# Patient Record
Sex: Female | Born: 1935 | Race: White | Hispanic: No | Marital: Married | Smoking: Never smoker
Health system: Southern US, Community
[De-identification: ages and names within clinical notes are randomized; demographics above are authoritative.]

## PROBLEM LIST (undated history)

## (undated) DIAGNOSIS — E079 Disorder of thyroid, unspecified: Secondary | ICD-10-CM

## (undated) DIAGNOSIS — M1990 Unspecified osteoarthritis, unspecified site: Secondary | ICD-10-CM

## (undated) DIAGNOSIS — Z87440 Personal history of urinary (tract) infections: Secondary | ICD-10-CM

## (undated) DIAGNOSIS — C50919 Malignant neoplasm of unspecified site of unspecified female breast: Secondary | ICD-10-CM

## (undated) DIAGNOSIS — R569 Unspecified convulsions: Secondary | ICD-10-CM

## (undated) DIAGNOSIS — Z87898 Personal history of other specified conditions: Secondary | ICD-10-CM

## (undated) DIAGNOSIS — C801 Malignant (primary) neoplasm, unspecified: Secondary | ICD-10-CM

## (undated) DIAGNOSIS — I10 Essential (primary) hypertension: Secondary | ICD-10-CM

## (undated) HISTORY — DX: Essential (primary) hypertension: I10

## (undated) HISTORY — DX: Disorder of thyroid, unspecified: E07.9

## (undated) HISTORY — DX: Unspecified osteoarthritis, unspecified site: M19.90

---
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
## 1998-09-05 HISTORY — PX: BLADDER REPAIR: SHX76

## 1999-09-06 HISTORY — PX: TOTAL ABDOMINAL HYSTERECTOMY: SHX209

## 2001-03-15 ENCOUNTER — Ambulatory Visit (HOSPITAL_COMMUNITY): Admission: RE | Admit: 2001-03-15 | Discharge: 2001-03-15 | Payer: Self-pay | Admitting: Internal Medicine

## 2001-05-18 ENCOUNTER — Encounter: Payer: Self-pay | Admitting: Internal Medicine

## 2001-05-18 ENCOUNTER — Encounter: Admission: RE | Admit: 2001-05-18 | Discharge: 2001-05-18 | Payer: Self-pay | Admitting: Internal Medicine

## 2001-05-28 ENCOUNTER — Encounter: Payer: Self-pay | Admitting: Internal Medicine

## 2001-05-28 ENCOUNTER — Encounter: Admission: RE | Admit: 2001-05-28 | Discharge: 2001-05-28 | Payer: Self-pay | Admitting: Internal Medicine

## 2003-06-12 ENCOUNTER — Encounter: Payer: Self-pay | Admitting: Internal Medicine

## 2003-06-12 ENCOUNTER — Encounter: Admission: RE | Admit: 2003-06-12 | Discharge: 2003-06-12 | Payer: Self-pay | Admitting: Internal Medicine

## 2004-06-01 ENCOUNTER — Ambulatory Visit (HOSPITAL_BASED_OUTPATIENT_CLINIC_OR_DEPARTMENT_OTHER): Admission: RE | Admit: 2004-06-01 | Discharge: 2004-06-01 | Payer: Self-pay | Admitting: Internal Medicine

## 2004-06-04 ENCOUNTER — Ambulatory Visit (HOSPITAL_COMMUNITY): Admission: RE | Admit: 2004-06-04 | Discharge: 2004-06-04 | Payer: Self-pay | Admitting: Internal Medicine

## 2004-06-16 ENCOUNTER — Ambulatory Visit (HOSPITAL_BASED_OUTPATIENT_CLINIC_OR_DEPARTMENT_OTHER): Admission: RE | Admit: 2004-06-16 | Discharge: 2004-06-16 | Payer: Self-pay | Admitting: Internal Medicine

## 2004-10-30 ENCOUNTER — Emergency Department (HOSPITAL_COMMUNITY): Admission: EM | Admit: 2004-10-30 | Discharge: 2004-10-30 | Payer: Self-pay | Admitting: Emergency Medicine

## 2005-09-20 ENCOUNTER — Encounter: Admission: RE | Admit: 2005-09-20 | Discharge: 2005-09-20 | Payer: Self-pay | Admitting: *Deleted

## 2006-09-05 HISTORY — PX: HIP ARTHROPLASTY: SHX981

## 2007-06-04 ENCOUNTER — Encounter: Admission: RE | Admit: 2007-06-04 | Discharge: 2007-06-04 | Payer: Self-pay | Admitting: Gastroenterology

## 2007-06-11 ENCOUNTER — Encounter: Admission: RE | Admit: 2007-06-11 | Discharge: 2007-06-11 | Payer: Self-pay | Admitting: Internal Medicine

## 2008-07-14 ENCOUNTER — Encounter: Admission: RE | Admit: 2008-07-14 | Discharge: 2008-07-14 | Payer: Self-pay | Admitting: Internal Medicine

## 2008-07-28 ENCOUNTER — Encounter: Admission: RE | Admit: 2008-07-28 | Discharge: 2008-07-28 | Payer: Self-pay | Admitting: Internal Medicine

## 2008-09-05 HISTORY — PX: OTHER SURGICAL HISTORY: SHX169

## 2010-04-30 ENCOUNTER — Encounter: Admission: RE | Admit: 2010-04-30 | Discharge: 2010-04-30 | Payer: Self-pay | Admitting: Internal Medicine

## 2010-09-26 ENCOUNTER — Encounter: Payer: Self-pay | Admitting: Internal Medicine
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
## 2011-01-21 NOTE — Procedures (Signed)
NAME:  Robin Yu, Robin Yu NO.:  [id number]   MEDICAL RECORD NO.:  [id number]          PATIENT TYPE:  OUT   LOCATION:  SLEEP CENTER                 FACILITY:  [facility]   PHYSICIAN:  Clinton D. Maple Hudson, M.D. DATE OF BIRTH:  10-15-1921   DATE OF STUDY:  06/16/2004                              NOCTURNAL POLYSOMNOGRAM   REFERRING PHYSICIAN:  Georgann Housekeeper, MD   INDICATIONS FOR STUDY:  Hypersomnia with sleep apnea. Diagnostic NPSG  June 01, 2004, recorded RDI of 20 per hour with desaturation to 70%.  CPAP titration is now scheduled. BMI 27.2, weight 160 pounds.   SLEEP ARCHITECTURE:  Short total sleep time 282 minutes with sleep  efficiency of 79%. Stage I was 8%, stage II 64%, stages III and IV were 10%.  REM was 18% of total sleep time.  Sleep latency 1 minute. REM latency 132  minutes. Awake after sleep onset 73 minutes. Arousal index 27.   RESPIRATORY DATA:  CPAP titration study.  CPAP was titrated to 10 CWP,  RDI  0 per hour using a petite Respironics Comfort Gel mask with heated  humidifier. The technician added a deluxe chin strap to reduce leaks through  the mouth. The patient responded and tolerated CPAP well.   OXYGEN DATA:  No snoring on CPAP. Oxygen desaturation to a nadir of 89%  before CPAP control, but held at 95% to 96% on room air with CPAP.   CARDIAC DATA:  Normal sinus rhythm with occasional PAC.   MOVEMENT/PARASOMNIA:  194 limb jerks were recorded of which 38 were  associated with arousal or awakening for periodic limb movement with arousal  index of 8 per hour, which is increased. Bathroom times three.   IMPRESSION/RECOMMENDATIONS:  Successful CPAP titration to 10 CWP, RDI 0 per  hour using a petite Respironics Comfort Gel mask with heated humidifier.  Technician also added a deluxe chin strap which may or may not be necessary  at home.  Baseline diagnostic NPSG on June 01, 2004, had recorded an  RDI of 20 per hour with desaturation to 78%.   Additional diagnosis of  periodic limb movement with arousal, 8 per hour.  Note that patient  complained of right ankle pain for which she was taking an antibiotic.  Technician noted lower extremity edema bilaterally, greatest in the right  ankle. Also note potential poor sleep hygiene patient. The patient indicates  she has no set sleep/wake routine and some times goes to bed very late.                                                           Clinton D. Maple Hudson, M.D.  Diplomate, [employer] Board   CDY/MEDQ  D:  06/20/2004 12:38:12  T:  06/20/2004 21:24:51  Job:  [id number]

## 2011-08-01 ENCOUNTER — Other Ambulatory Visit: Payer: Self-pay | Admitting: Internal Medicine

## 2011-08-01 DIAGNOSIS — R52 Pain, unspecified: Secondary | ICD-10-CM

## 2011-08-01 DIAGNOSIS — R609 Edema, unspecified: Secondary | ICD-10-CM

## 2011-08-02 ENCOUNTER — Ambulatory Visit
Admission: RE | Admit: 2011-08-02 | Discharge: 2011-08-02 | Disposition: A | Discharge status: Home / Self Care | Payer: Medicare Other | Source: Ambulatory Visit | Attending: Internal Medicine | Admitting: Internal Medicine

## 2011-08-02 DIAGNOSIS — R52 Pain, unspecified: Secondary | ICD-10-CM

## 2011-08-02 DIAGNOSIS — R609 Edema, unspecified: Secondary | ICD-10-CM

## 2013-01-31 ENCOUNTER — Other Ambulatory Visit: Payer: Self-pay | Admitting: Internal Medicine

## 2013-01-31 DIAGNOSIS — Z1231 Encounter for screening mammogram for malignant neoplasm of breast: Secondary | ICD-10-CM

## 2013-03-07 ENCOUNTER — Ambulatory Visit: Payer: Medicare Other

## 2013-05-02 ENCOUNTER — Other Ambulatory Visit: Payer: Self-pay

## 2013-05-02 ENCOUNTER — Other Ambulatory Visit: Payer: Self-pay | Admitting: Internal Medicine

## 2013-05-02 DIAGNOSIS — Z1231 Encounter for screening mammogram for malignant neoplasm of breast: Secondary | ICD-10-CM

## 2013-05-17 ENCOUNTER — Ambulatory Visit: Payer: Medicare Other

## 2014-02-05 ENCOUNTER — Other Ambulatory Visit: Payer: Self-pay | Admitting: Internal Medicine

## 2014-02-05 DIAGNOSIS — N6320 Unspecified lump in the left breast, unspecified quadrant: Principal | ICD-10-CM

## 2014-02-05 DIAGNOSIS — N6325 Unspecified lump in the left breast, overlapping quadrants: Secondary | ICD-10-CM

## 2014-02-10 ENCOUNTER — Ambulatory Visit
Admission: RE | Admit: 2014-02-10 | Discharge: 2014-02-10 | Disposition: A | Discharge status: Home / Self Care | Payer: Commercial Managed Care - HMO | Source: Ambulatory Visit | Attending: Internal Medicine | Admitting: Internal Medicine

## 2014-02-10 ENCOUNTER — Other Ambulatory Visit: Payer: Self-pay | Admitting: Internal Medicine

## 2014-02-10 DIAGNOSIS — N6325 Unspecified lump in the left breast, overlapping quadrants: Secondary | ICD-10-CM

## 2014-02-10 DIAGNOSIS — N6320 Unspecified lump in the left breast, unspecified quadrant: Principal | ICD-10-CM

## 2014-02-13 ENCOUNTER — Ambulatory Visit: Payer: Medicare Other | Admitting: Physical Therapy

## 2014-02-19 ENCOUNTER — Ambulatory Visit
Admission: RE | Admit: 2014-02-19 | Discharge: 2014-02-19 | Disposition: A | Discharge status: Home / Self Care | Payer: Commercial Managed Care - HMO | Source: Ambulatory Visit | Attending: Internal Medicine | Admitting: Internal Medicine

## 2014-02-19 ENCOUNTER — Other Ambulatory Visit: Payer: Self-pay | Admitting: Internal Medicine

## 2014-02-19 DIAGNOSIS — N6325 Unspecified lump in the left breast, overlapping quadrants: Secondary | ICD-10-CM

## 2014-02-19 DIAGNOSIS — N6320 Unspecified lump in the left breast, unspecified quadrant: Principal | ICD-10-CM

## 2014-02-25 ENCOUNTER — Ambulatory Visit (INDEPENDENT_AMBULATORY_CARE_PROVIDER_SITE_OTHER): Payer: Commercial Managed Care - HMO | Admitting: General Surgery

## 2014-02-25 ENCOUNTER — Encounter (INDEPENDENT_AMBULATORY_CARE_PROVIDER_SITE_OTHER): Payer: Self-pay | Admitting: General Surgery

## 2014-02-25 ENCOUNTER — Ambulatory Visit: Payer: Commercial Managed Care - HMO | Attending: Internal Medicine | Admitting: Physical Therapy

## 2014-02-25 VITALS — BP 118/75 | HR 73 | Temp 98.4°F | Resp 14 | Ht 62.5 in | Wt 161.4 lb

## 2014-02-25 DIAGNOSIS — C50912 Malignant neoplasm of unspecified site of left female breast: Secondary | ICD-10-CM

## 2014-02-25 DIAGNOSIS — C50919 Malignant neoplasm of unspecified site of unspecified female breast: Secondary | ICD-10-CM

## 2014-02-25 NOTE — Progress Notes (Signed)
Chief Complaint: New diagnosis of breast cancer  History:    Robin Yu is a 80 y.o. postmenopausal female referred by Dr. Everlean Alstrom  for evaluation of recently diagnosed carcinoma of the left breast. She recently presented routine medical followup with Dr. Lorenda Hatchet and a left breast mass was noted..  Subsequent imaging included diagnostic mamogram showing architectural distortion behind the left nipple without apparent mass or suspicious calcifications. Some retraction of the left nipple was noted.  Ultrasound was performed showing a 2.8 x 1.5 x 2.5 cm irregular mass at the 12:00 position 1 cm from the nipple..   A ultrasound guided biopsy was performed on 02/19/2014 with pathology revealing invasive and in situ mammary carcinoma. Prognostic panel is currently pending. She is seen now in the office for initial treatment planning. The patient is in generally very good health for her age, living relatively independently without major medical problems. She has experienced no symptoms prior to this being discovered.  She does not have a personal history of any previous breast problems.  Findings at that time were the following:  Tumor size: 2.8 cm  Tumor grade: 2  Estrogen Receptor: pending Progesterone Receptor: pending  Her-2 neu: pending  Lymph node status: negative    Past Medical History  Diagnosis Date  . Arthritis   . Hypertension   . Thyroid disease     Past Surgical History  Procedure Laterality Date  . Hip arthroplasty  2008  . Total abdominal hysterectomy  2001  . Tonsillectomy  1940  . Bladder repair  2000  . Toe nail  2010    Current Outpatient Prescriptions  Medication Sig Dispense Refill  . amLODipine (NORVASC) 5 MG tablet Take 5 mg by mouth daily.      . calcium carbonate 200 MG capsule Take 250 mg by mouth 2 (two) times daily with a meal.      . Cranberry 125 MG TABS Take by mouth.      . divalproex (DEPAKOTE) 250 MG DR tablet Take 250 mg by mouth daily.       Marland Kitchen levothyroxine (SYNTHROID, LEVOTHROID) 50 MCG tablet Take 25 mcg by mouth daily before breakfast.       No current facility-administered medications for this visit.    Family History  Problem Relation Age of Onset  . Stroke Father     History   Social History  . Marital Status: Married    Spouse Name: N/A    Number of Children: N/A  . Years of Education: N/A   Social History Main Topics  . Smoking status: Never Smoker   . Smokeless tobacco: None  . Alcohol Use: No  . Drug Use: No  . Sexual Activity: None   Other Topics Concern  . None   Social History Narrative  . None     Review of Systems Constitutional: negative Ears, nose, mouth, throat, and face: positive for hearing loss Respiratory: negative Cardiovascular: negative Gastrointestinal: negative Genitourinary:positive for frequent urinary infections     Objective:  BP 118/75  Pulse 73  Temp(Src) 98.4 F (36.9 C)  Resp 14  Ht 5' 2.5" (1.588 m)  Wt 161 lb 6.4 oz (73.211 kg)  BMI 29.03 kg/m2  General: Alert, well-developed elderly female looking younger than her stated age, in no distress Skin: Warm and dry without rash or infection.  Multiple seborrheic keratoses on the trunk HEENT: No palpable masses or thyromegaly. Sclera nonicteric. Pupils equal round and reactive. Oropharynx clear. Breasts: in the left  breast just above and lateral to the nipple is an approximately 2-3 cm firm freely movable mass. Possible minimal nipple retraction. No skin changes. No other masses in either breast. Lymph nodes: No cervical, supraclavicular, or inguinal nodes palpable. Lungs: Breath sounds clear and equal without increased work of breathing Cardiovascular: Regular rate and rhythm without murmur. No JVD or edema. Peripheral pulses intact. Abdomen: Nondistended. Soft and nontender. No masses palpable. No organomegaly. No palpable hernias. Extremities: No edema or joint swelling or deformity. No chronic venous stasis  changes. Neurologic: Alert and fully oriented. Gait normal.   Laboratory data:  CBC:  No results found for this basename: WBC, RBC, HGB, HCT, PLT  ]  CM[facility] Labs:  No results found for this basename: GLUF, NA, K, CL, CO2, BUN, CREATININE, CALCIUM, PROT, ALB, BILITOT, BILIDIR, ALKPHOS, AST, ALT     Assessment  80 y.o. female with a new diagnosis of cancer of the the left breast upper outer quadrant.  Clinical IIA, prognostic panel pending. I discussed with the patient and daughter and close friend present today initial surgical treatment options. We discussed that at age 69 the office and tried to find an effective treatment that is not necessarily standard designed to minimize side effects. The patient is an overall very good health for age. I would favor initial surgical treatment. Her tumor is moderately large at 2.8 cm in directly behind the nipple. We could do a central lumpectomy but I believe would have to remove the nipple areolar complex and in that case I think a simple mastectomy might be the best treatment. She likely would not require any additional treatment. I discussed other potential options including lumpectomy alone, lumpectomy with hormone treatment or hormone treatment alone. I would not recommend lymph node evaluation formally. It is difficult to make a final decision today as we do not yet have the prognostic panel. We discussed all these treatments in detail. I would tend to lean toward simple mastectomy. The patient and her family would like to see medical oncology prior to making a final decision and we will make this referral for them. I will call them we'll have the results of the hormone receptors and see what they are thinking at that point.  Plan Await prognostic panel. Medical oncology evaluation. Possible simple mastectomy or lumpectomy.  Edward Jolly MD, FACS  02/25/2014, 3:55 PM

## 2014-02-26 ENCOUNTER — Other Ambulatory Visit (INDEPENDENT_AMBULATORY_CARE_PROVIDER_SITE_OTHER): Payer: Self-pay | Admitting: General Surgery

## 2014-02-27 ENCOUNTER — Telehealth (INDEPENDENT_AMBULATORY_CARE_PROVIDER_SITE_OTHER): Payer: Self-pay | Admitting: General Surgery

## 2014-02-27 NOTE — Telephone Encounter (Signed)
Spoke with daughter regarding treatment planning. She states the patient would like to have surgery and does not want hormone treatment. We discussed surgical treatment and we will plan to proceed with simple mastectomy as discussed.

## 2014-02-28 ENCOUNTER — Telehealth: Payer: Self-pay | Admitting: *Deleted

## 2014-02-28 NOTE — Telephone Encounter (Signed)
Received email back from [place] at [facility] stating that the pt has declined treatment.  I am cancelling the referral.

## 2014-02-28 NOTE — Telephone Encounter (Signed)
Received referral from [facility] and looked at the notes and it was questionable about after surgery treatment with the family members.  Emailed Selena Swaminathan at [employer] for clarification on Med Onc appt.   Awaiting response before I proceed.

## 2014-03-05 ENCOUNTER — Encounter (HOSPITAL_COMMUNITY): Payer: Self-pay | Admitting: Pharmacy Technician

## 2014-03-12 ENCOUNTER — Encounter (HOSPITAL_COMMUNITY)
Admission: RE | Admit: 2014-03-12 | Discharge: 2014-03-12 | Disposition: A | Discharge status: Home / Self Care | Payer: Medicare HMO | Source: Ambulatory Visit | Attending: General Surgery | Admitting: General Surgery

## 2014-03-12 ENCOUNTER — Encounter (HOSPITAL_COMMUNITY): Payer: Self-pay

## 2014-03-12 ENCOUNTER — Ambulatory Visit (HOSPITAL_COMMUNITY)
Admission: RE | Admit: 2014-03-12 | Discharge: 2014-03-12 | Disposition: A | Discharge status: Home / Self Care | Payer: Medicare HMO | Source: Ambulatory Visit | Attending: Anesthesiology | Admitting: Anesthesiology

## 2014-03-12 ENCOUNTER — Other Ambulatory Visit: Payer: Self-pay

## 2014-03-12 DIAGNOSIS — Z01812 Encounter for preprocedural laboratory examination: Secondary | ICD-10-CM | POA: Diagnosis not present

## 2014-03-12 DIAGNOSIS — Z01810 Encounter for preprocedural cardiovascular examination: Secondary | ICD-10-CM | POA: Insufficient documentation

## 2014-03-12 DIAGNOSIS — R918 Other nonspecific abnormal finding of lung field: Secondary | ICD-10-CM | POA: Diagnosis not present

## 2014-03-12 DIAGNOSIS — Z01818 Encounter for other preprocedural examination: Secondary | ICD-10-CM | POA: Insufficient documentation

## 2014-03-12 DIAGNOSIS — I10 Essential (primary) hypertension: Secondary | ICD-10-CM | POA: Diagnosis not present

## 2014-03-12 DIAGNOSIS — C50919 Malignant neoplasm of unspecified site of unspecified female breast: Secondary | ICD-10-CM | POA: Insufficient documentation

## 2014-03-12 DIAGNOSIS — M954 Acquired deformity of chest and rib: Secondary | ICD-10-CM | POA: Insufficient documentation

## 2014-03-12 HISTORY — DX: Malignant (primary) neoplasm, unspecified: C80.1

## 2014-03-12 HISTORY — DX: Personal history of urinary (tract) infections: Z87.440

## 2014-03-12 HISTORY — DX: Personal history of other specified conditions: Z87.898

## 2014-03-12 HISTORY — DX: Unspecified convulsions: R56.9

## 2014-03-12 HISTORY — DX: Malignant neoplasm of unspecified site of unspecified female breast: C50.919

## 2014-03-12 LAB — BASIC METABOLIC PANEL
Anion gap: 13 (ref 5–15)
BUN: 22 mg/dL (ref 6–23)
CO2: 27 meq/L (ref 19–32)
CREATININE: 0.66 mg/dL (ref 0.50–1.10)
Calcium: 10.2 mg/dL (ref 8.4–10.5)
Chloride: 97 mEq/L (ref 96–112)
GFR calc Af Amer: 87 mL/min — ABNORMAL LOW (ref >90)
GFR, EST NON AFRICAN AMERICAN: 75 mL/min — AB (ref >90)
GLUCOSE: 95 mg/dL (ref 70–99)
Potassium: 4.5 mEq/L (ref 3.7–5.3)
Sodium: 137 mEq/L (ref 137–147)

## 2014-03-12 LAB — CBC
HCT: 41.2 % (ref 36.0–46.0)
Hemoglobin: 13.6 g/dL (ref 12.0–15.0)
MCH: 29.2 pg (ref 26.0–34.0)
MCHC: 33 g/dL (ref 30.0–36.0)
MCV: 88.4 fL (ref 78.0–100.0)
Platelets: 254 10*3/uL (ref 150–400)
RBC: 4.66 MIL/uL (ref 3.87–5.11)
RDW: 13.7 % (ref 11.5–15.5)
WBC: 8.1 10*3/uL (ref 4.0–10.5)

## 2014-03-12 NOTE — Patient Instructions (Addendum)
Robin Yu  03/12/2014                           YOUR PROCEDURE IS SCHEDULED ON: 03/18/14 AT 7:30 AM               ENTER [place] ENTRANCE AND                            FOLLOW  SIGNS TO SHORT STAY CENTER                 ARRIVE AT SHORT STAY AT:  5:30 AM               CALL THIS NUMBER IF ANY PROBLEMS THE DAY OF SURGERY :               [phone]                                REMEMBER:   Do not eat food or drink liquids AFTER MIDNIGHT                  Take these medicines the morning of surgery with               A SIPS OF WATER :  AMLODIPINE / DEPAKOTE / LEVOTHYROXINE / METOPROLOL             DISCONTINUE ASPIRIN AND HERBAL MEDS 5 DAYS PREOP       Do not wear jewelry, make-up   Do not wear lotions, powders, or perfumes.   Do not shave legs or underarms 12 hrs. before surgery (men may shave face)  Do not bring valuables to the hospital.  Contacts, dentures or bridgework may not be worn into surgery.  Leave suitcase in the car. After surgery it may be brought to your room.  For patients admitted to the hospital more than one night, checkout time is            11:00 AM                                                       The day of discharge.   Patients discharged the day of surgery will not be allowed to drive home.            If going home same day of surgery, must have someone stay with you              FIRST 24 hrs at home and arrange for some one to drive you              home from hospital.   ________________________________________________________________________                [place]  Before surgery, you can play an important role.  Because skin is not sterile, your skin needs to be as free of germs as possible.  You can reduce the number of germs on your skin by washing with CHG (chlorahexidine gluconate) soap before surgery.  CHG is an antiseptic cleaner which kills germs and bonds with the skin to  continue killing germs  even after washing. Please DO NOT use if you have an allergy to CHG or antibacterial soaps.  If your skin becomes reddened/irritated stop using the CHG and inform your nurse when you arrive at Short Stay. Do not shave (including legs and underarms) for at least 48 hours prior to the first CHG shower.  You may shave your face. Please follow these instructions carefully:   1.  Shower with CHG Soap the night before surgery and the  morning of Surgery.   2.  If you choose to wash your hair, wash your hair first as usual with your  normal  Shampoo.   3.  After you shampoo, rinse your hair and body thoroughly to remove the  shampoo.                                         4.  Use CHG as you would any other liquid soap.  You can apply chg directly  to the skin and wash . Gently wash with scrungie or clean wascloth    5.  Apply the CHG Soap to your body ONLY FROM THE NECK DOWN.   Do not use on open                           Wound or open sores. Avoid contact with eyes, ears mouth and genitals (private parts).                        Genitals (private parts) with your normal soap.              6.  Wash thoroughly, paying special attention to the area where your surgery  will be performed.   7.  Thoroughly rinse your body with warm water from the neck down.   8.  DO NOT shower/wash with your normal soap after using and rinsing off  the CHG Soap .                9.  Pat yourself dry with a clean towel.             10.  Wear clean pajamas.             11.  Place clean sheets on your bed the night of your first shower and do not  sleep with pets.  Day of Surgery : Do not apply any lotions/deodorants the morning of surgery.  Please wear clean clothes to the hospital/surgery center.  FAILURE TO FOLLOW THESE INSTRUCTIONS MAY RESULT IN THE CANCELLATION OF YOUR SURGERY    PATIENT  SIGNATURE_________________________________  ______________________________________________________________________                                                                         Robin Yu  03/12/2014                          `

## 2014-03-14 ENCOUNTER — Other Ambulatory Visit (INDEPENDENT_AMBULATORY_CARE_PROVIDER_SITE_OTHER): Payer: Self-pay

## 2014-03-14 ENCOUNTER — Telehealth (INDEPENDENT_AMBULATORY_CARE_PROVIDER_SITE_OTHER): Payer: Self-pay

## 2014-03-14 DIAGNOSIS — C50919 Malignant neoplasm of unspecified site of unspecified female breast: Secondary | ICD-10-CM

## 2014-03-14 NOTE — Telephone Encounter (Signed)
Called to make family aware that patient will need a Chest CT Monday per Dr. Excell Seltzer.  Family aware that we will call Monday morning with time of arrival for test.  Family requested to have CT after 3pm if possible.  Family advised that Dr. Excell Seltzer will review results and discuss the morning of surgery (Tuesday)  Chest CT order placed in our referral coordinator tray to schedule.

## 2014-03-17 ENCOUNTER — Other Ambulatory Visit: Payer: Commercial Managed Care - HMO

## 2014-03-17 ENCOUNTER — Ambulatory Visit
Admission: RE | Admit: 2014-03-17 | Discharge: 2014-03-17 | Disposition: A | Discharge status: Home / Self Care | Payer: Commercial Managed Care - HMO | Source: Ambulatory Visit | Attending: General Surgery | Admitting: General Surgery

## 2014-03-17 DIAGNOSIS — C50919 Malignant neoplasm of unspecified site of unspecified female breast: Secondary | ICD-10-CM

## 2014-03-17 MED ORDER — IOHEXOL 300 MG/ML  SOLN
75.0000 mL | Freq: Once | INTRAMUSCULAR | Status: AC | PRN
  Administered 2014-03-17: 75 mL via INTRAVENOUS

## 2014-03-18 ENCOUNTER — Encounter (HOSPITAL_COMMUNITY): Payer: Medicare HMO | Admitting: Certified Registered Nurse Anesthetist

## 2014-03-18 ENCOUNTER — Ambulatory Visit (HOSPITAL_COMMUNITY): Payer: Medicare HMO | Admitting: Certified Registered Nurse Anesthetist

## 2014-03-18 ENCOUNTER — Encounter (HOSPITAL_COMMUNITY)
Admission: RE | Disposition: A | Discharge status: Home / Self Care | Payer: Self-pay | Source: Ambulatory Visit | Attending: General Surgery

## 2014-03-18 ENCOUNTER — Observation Stay (HOSPITAL_COMMUNITY)
Admission: RE | Admit: 2014-03-18 | Discharge: 2014-03-19 | Disposition: A | Discharge status: Home / Self Care | Payer: Medicare HMO | Source: Ambulatory Visit | Attending: General Surgery | Admitting: General Surgery

## 2014-03-18 ENCOUNTER — Encounter (HOSPITAL_COMMUNITY): Payer: Self-pay | Admitting: *Deleted

## 2014-03-18 DIAGNOSIS — I10 Essential (primary) hypertension: Secondary | ICD-10-CM | POA: Insufficient documentation

## 2014-03-18 DIAGNOSIS — L821 Other seborrheic keratosis: Secondary | ICD-10-CM | POA: Insufficient documentation

## 2014-03-18 DIAGNOSIS — C50919 Malignant neoplasm of unspecified site of unspecified female breast: Secondary | ICD-10-CM | POA: Diagnosis not present

## 2014-03-18 DIAGNOSIS — R569 Unspecified convulsions: Secondary | ICD-10-CM | POA: Diagnosis not present

## 2014-03-18 DIAGNOSIS — C50912 Malignant neoplasm of unspecified site of left female breast: Secondary | ICD-10-CM

## 2014-03-18 HISTORY — PX: TOTAL MASTECTOMY: SHX6129

## 2014-03-18 SURGERY — MASTECTOMY, SIMPLE
Anesthesia: General | Site: Breast | Laterality: Left

## 2014-03-18 MED ORDER — DIVALPROEX SODIUM 250 MG PO DR TAB: 250.0000 mg | DELAYED_RELEASE_TABLET | Freq: Two times a day (BID) | ORAL | Status: DC

## 2014-03-18 MED ORDER — AMLODIPINE BESYLATE 5 MG PO TABS
5.0000 mg | ORAL_TABLET | Freq: Every morning | ORAL | Status: DC
  Administered 2014-03-18: 5 mg via ORAL
  Filled 2014-03-18 (×2): qty 1

## 2014-03-18 MED ORDER — LEVOTHYROXINE SODIUM 25 MCG PO TABS
25.0000 ug | ORAL_TABLET | Freq: Every day | ORAL | Status: DC
  Administered 2014-03-19: 25 ug via ORAL
  Filled 2014-03-18 (×2): qty 1

## 2014-03-18 MED ORDER — LACTATED RINGERS IV SOLN
INTRAVENOUS | Status: DC | PRN
  Administered 2014-03-18 (×2): via INTRAVENOUS

## 2014-03-18 MED ORDER — HYDROMORPHONE HCL PF 1 MG/ML IJ SOLN: 0.2500 mg | INTRAMUSCULAR | Status: DC | PRN

## 2014-03-18 MED ORDER — SUCCINYLCHOLINE CHLORIDE 20 MG/ML IJ SOLN
INTRAMUSCULAR | Status: DC | PRN
  Administered 2014-03-18: 100 mg via INTRAVENOUS

## 2014-03-18 MED ORDER — MORPHINE SULFATE 10 MG/ML IJ SOLN: 1.0000 mg | INTRAMUSCULAR | Status: DC | PRN

## 2014-03-18 MED ORDER — FENTANYL CITRATE 0.05 MG/ML IJ SOLN
INTRAMUSCULAR | Status: AC
  Filled 2014-03-18: qty 5

## 2014-03-18 MED ORDER — ACETAMINOPHEN 10 MG/ML IV SOLN
1000.0000 mg | Freq: Once | INTRAVENOUS | Status: AC
  Administered 2014-03-18: 1000 mg via INTRAVENOUS
  Filled 2014-03-18: qty 100

## 2014-03-18 MED ORDER — ACETAMINOPHEN 500 MG PO TABS: 1000.0000 mg | ORAL_TABLET | Freq: Four times a day (QID) | ORAL | Status: DC | PRN

## 2014-03-18 MED ORDER — CIPROFLOXACIN IN D5W 400 MG/200ML IV SOLN
INTRAVENOUS | Status: AC
  Filled 2014-03-18: qty 200

## 2014-03-18 MED ORDER — DEXAMETHASONE SODIUM PHOSPHATE 10 MG/ML IJ SOLN
INTRAMUSCULAR | Status: AC
  Filled 2014-03-18: qty 1

## 2014-03-18 MED ORDER — ADULT MULTIVITAMIN W/MINERALS CH
1.0000 | ORAL_TABLET | Freq: Every day | ORAL | Status: DC
  Administered 2014-03-19: 1 via ORAL
  Filled 2014-03-18 (×2): qty 1

## 2014-03-18 MED ORDER — EPHEDRINE SULFATE 50 MG/ML IJ SOLN
INTRAMUSCULAR | Status: AC
  Filled 2014-03-18: qty 1

## 2014-03-18 MED ORDER — METOCLOPRAMIDE HCL 5 MG/ML IJ SOLN
INTRAMUSCULAR | Status: AC
  Filled 2014-03-18: qty 2

## 2014-03-18 MED ORDER — DIVALPROEX SODIUM 250 MG PO DR TAB
250.0000 mg | DELAYED_RELEASE_TABLET | Freq: Every day | ORAL | Status: DC
  Administered 2014-03-19: 250 mg via ORAL
  Filled 2014-03-18: qty 1

## 2014-03-18 MED ORDER — EPHEDRINE SULFATE 50 MG/ML IJ SOLN
INTRAMUSCULAR | Status: DC | PRN
  Administered 2014-03-18 (×5): 5 mg via INTRAVENOUS

## 2014-03-18 MED ORDER — SODIUM CHLORIDE 0.9 % IJ SOLN
INTRAMUSCULAR | Status: AC
  Filled 2014-03-18: qty 10

## 2014-03-18 MED ORDER — ATROPINE SULFATE 0.4 MG/ML IJ SOLN
INTRAMUSCULAR | Status: AC
  Filled 2014-03-18: qty 1

## 2014-03-18 MED ORDER — LIDOCAINE HCL (CARDIAC) 20 MG/ML IV SOLN
INTRAVENOUS | Status: AC
  Filled 2014-03-18: qty 5

## 2014-03-18 MED ORDER — CHLORHEXIDINE GLUCONATE 4 % EX LIQD: 1.0000 "application " | Freq: Once | CUTANEOUS | Status: DC

## 2014-03-18 MED ORDER — LIDOCAINE HCL (CARDIAC) 20 MG/ML IV SOLN
INTRAVENOUS | Status: DC | PRN
  Administered 2014-03-18: 50 mg via INTRAVENOUS

## 2014-03-18 MED ORDER — ONDANSETRON HCL 4 MG/2ML IJ SOLN
INTRAMUSCULAR | Status: DC | PRN
  Administered 2014-03-18 (×2): 2 mg via INTRAVENOUS

## 2014-03-18 MED ORDER — MORPHINE SULFATE 2 MG/ML IJ SOLN
1.0000 mg | INTRAMUSCULAR | Status: DC | PRN
  Administered 2014-03-18 (×2): 2 mg via INTRAVENOUS
  Filled 2014-03-18 (×2): qty 1

## 2014-03-18 MED ORDER — POTASSIUM CHLORIDE IN NACL 20-0.9 MEQ/L-% IV SOLN
INTRAVENOUS | Status: DC
  Administered 2014-03-18 – 2014-03-19 (×2): via INTRAVENOUS
  Filled 2014-03-18 (×2): qty 1000

## 2014-03-18 MED ORDER — LACTATED RINGERS IV SOLN: INTRAVENOUS | Status: DC

## 2014-03-18 MED ORDER — PROPOFOL 10 MG/ML IV BOLUS
INTRAVENOUS | Status: DC | PRN
  Administered 2014-03-18: 50 mg via INTRAVENOUS
  Administered 2014-03-18: 90 mg via INTRAVENOUS

## 2014-03-18 MED ORDER — PROPOFOL 10 MG/ML IV BOLUS
INTRAVENOUS | Status: AC
  Filled 2014-03-18: qty 20

## 2014-03-18 MED ORDER — 0.9 % SODIUM CHLORIDE (POUR BTL) OPTIME
TOPICAL | Status: DC | PRN
  Administered 2014-03-18: 1000 mL

## 2014-03-18 MED ORDER — ONDANSETRON HCL 4 MG/2ML IJ SOLN: 4.0000 mg | Freq: Four times a day (QID) | INTRAMUSCULAR | Status: DC | PRN

## 2014-03-18 MED ORDER — ONDANSETRON HCL 4 MG/2ML IJ SOLN
INTRAMUSCULAR | Status: AC
  Filled 2014-03-18: qty 2

## 2014-03-18 MED ORDER — METOPROLOL TARTRATE 25 MG PO TABS
25.0000 mg | ORAL_TABLET | Freq: Two times a day (BID) | ORAL | Status: DC
  Administered 2014-03-18 – 2014-03-19 (×2): 25 mg via ORAL
  Filled 2014-03-18 (×3): qty 1

## 2014-03-18 MED ORDER — ONDANSETRON HCL 4 MG PO TABS: 4.0000 mg | ORAL_TABLET | Freq: Four times a day (QID) | ORAL | Status: DC | PRN

## 2014-03-18 MED ORDER — DIVALPROEX SODIUM 500 MG PO DR TAB
500.0000 mg | DELAYED_RELEASE_TABLET | Freq: Every day | ORAL | Status: DC
  Administered 2014-03-18: 500 mg via ORAL
  Filled 2014-03-18 (×2): qty 1

## 2014-03-18 MED ORDER — FENTANYL CITRATE 0.05 MG/ML IJ SOLN
INTRAMUSCULAR | Status: DC | PRN
  Administered 2014-03-18: 100 ug via INTRAVENOUS
  Administered 2014-03-18 (×3): 50 ug via INTRAVENOUS

## 2014-03-18 MED ORDER — MIDAZOLAM HCL 2 MG/2ML IJ SOLN
INTRAMUSCULAR | Status: AC
  Filled 2014-03-18: qty 2

## 2014-03-18 MED ORDER — HEPARIN SODIUM (PORCINE) 5000 UNIT/ML IJ SOLN
5000.0000 [IU] | Freq: Three times a day (TID) | INTRAMUSCULAR | Status: DC
Start: 2014-03-18 — End: 2014-03-19
  Administered 2014-03-18 – 2014-03-19 (×2): 5000 [IU] via SUBCUTANEOUS
  Filled 2014-03-18 (×5): qty 1

## 2014-03-18 MED ORDER — HYDROCODONE-ACETAMINOPHEN 5-325 MG PO TABS
1.0000 | ORAL_TABLET | ORAL | Status: DC | PRN
  Administered 2014-03-18 – 2014-03-19 (×2): 1 via ORAL
  Filled 2014-03-18 (×2): qty 1

## 2014-03-18 MED ORDER — CIPROFLOXACIN IN D5W 400 MG/200ML IV SOLN
400.0000 mg | INTRAVENOUS | Status: AC
  Administered 2014-03-18: 400 mg via INTRAVENOUS

## 2014-03-18 MED ORDER — MORPHINE SULFATE 2 MG/ML IJ SOLN
INTRAMUSCULAR | Status: AC
  Administered 2014-03-18: 2 mg via INTRAVENOUS
  Filled 2014-03-18: qty 1

## 2014-03-18 MED ORDER — DEXAMETHASONE SODIUM PHOSPHATE 10 MG/ML IJ SOLN
INTRAMUSCULAR | Status: DC | PRN
  Administered 2014-03-18: 10 mg via INTRAVENOUS

## 2014-03-18 MED ORDER — MORPHINE SULFATE 2 MG/ML IJ SOLN
INTRAMUSCULAR | Status: AC
  Administered 2014-03-18: 1 mg via INTRAVASCULAR
  Filled 2014-03-18: qty 1

## 2014-03-18 SURGICAL SUPPLY — 48 items
APL SKNCLS STERI-STRIP NONHPOA (GAUZE/BANDAGES/DRESSINGS)
APPLIER CLIP 11 MED OPEN (CLIP)
APR CLP MED 11 20 MLT OPN (CLIP)
BENZOIN TINCTURE PRP APPL 2/3 (GAUZE/BANDAGES/DRESSINGS) ×2 IMPLANT
BLADE HEX COATED 2.75 (ELECTRODE) ×5 IMPLANT
BLADE SURG SZ10 CARB STEEL (BLADE) ×2 IMPLANT
CANISTER SUCTION 2500CC (MISCELLANEOUS) ×2 IMPLANT
CLIP APPLIE 11 MED OPEN (CLIP) IMPLANT
CLOSURE WOUND 1/2 X4 (GAUZE/BANDAGES/DRESSINGS)
COVER MAYO STAND STRL (DRAPES) IMPLANT
DRAIN CHANNEL 19F RND (DRAIN) ×2 IMPLANT
DRAIN CHANNEL RND F F (WOUND CARE) ×4 IMPLANT
DRAPE LAPAROSCOPIC ABDOMINAL (DRAPES) ×4 IMPLANT
DRAPE LG THREE QUARTER DISP (DRAPES) ×4 IMPLANT
DRSG PAD ABDOMINAL 8X10 ST (GAUZE/BANDAGES/DRESSINGS) ×2 IMPLANT
DRSG VASELINE 3X18 (GAUZE/BANDAGES/DRESSINGS) IMPLANT
ELECT REM PT RETURN 9FT ADLT (ELECTROSURGICAL) ×6
ELECTRODE REM PT RTRN 9FT ADLT (ELECTROSURGICAL) ×2 IMPLANT
EVACUATOR DRAINAGE 10X20 100CC (DRAIN) IMPLANT
EVACUATOR SILICONE 100CC (DRAIN) ×7 IMPLANT
GAUZE SPONGE 4X4 12PLY STRL (GAUZE/BANDAGES/DRESSINGS) ×4 IMPLANT
GLOVE BIOGEL PI IND STRL 7.0 (GLOVE) ×1 IMPLANT
GLOVE BIOGEL PI IND STRL 7.5 (GLOVE) IMPLANT
GLOVE BIOGEL PI INDICATOR 7.0 (GLOVE) ×6
GLOVE BIOGEL PI INDICATOR 7.5 (GLOVE) ×2
GLOVE SS BIOGEL STRL SZ 7.5 (GLOVE) IMPLANT
GLOVE SUPERSENSE BIOGEL SZ 7.5 (GLOVE) ×2
GOWN STRL REUS W/TWL LRG LVL3 (GOWN DISPOSABLE) ×3 IMPLANT
GOWN STRL REUS W/TWL XL LVL3 (GOWN DISPOSABLE) ×6 IMPLANT
KIT BASIN OR (CUSTOM PROCEDURE TRAY) ×4 IMPLANT
MARKER SKIN DUAL TIP RULER LAB (MISCELLANEOUS) ×2 IMPLANT
NS IRRIG 1000ML POUR BTL (IV SOLUTION) ×4 IMPLANT
PACK GENERAL/GYN (CUSTOM PROCEDURE TRAY) ×4 IMPLANT
SPONGE DRAIN TRACH 4X4 STRL 2S (GAUZE/BANDAGES/DRESSINGS) ×2 IMPLANT
SPONGE GAUZE 4X4 12PLY (GAUZE/BANDAGES/DRESSINGS) ×2 IMPLANT
SPONGE LAP 18X18 X RAY DECT (DISPOSABLE) ×8 IMPLANT
STAPLER VISISTAT 35W (STAPLE) ×4 IMPLANT
STRIP CLOSURE SKIN 1/2X4 (GAUZE/BANDAGES/DRESSINGS) ×2 IMPLANT
SUT ETHILON 3 0 PS 1 (SUTURE) ×2 IMPLANT
SUT ETHILON 4 0 PS 2 18 (SUTURE) IMPLANT
SUT VIC AB 3-0 54XBRD REEL (SUTURE) ×1 IMPLANT
SUT VIC AB 3-0 BRD 54 (SUTURE)
SUT VIC AB 3-0 SH 18 (SUTURE) ×4 IMPLANT
SUT VIC AB 3-0 SH 27 (SUTURE)
SUT VIC AB 3-0 SH 27XBRD (SUTURE) ×1 IMPLANT
TAPE CLOTH SURG 4X10 WHT LF (GAUZE/BANDAGES/DRESSINGS) ×2 IMPLANT
TOWEL OR 17X26 10 PK STRL BLUE (TOWEL DISPOSABLE) ×4 IMPLANT
TOWEL OR NON WOVEN STRL DISP B (DISPOSABLE) ×2 IMPLANT

## 2014-03-18 NOTE — Anesthesia Procedure Notes (Signed)
Procedure Name: Intubation Date/Time: 03/18/2014 7:41 AM Performed by: Ofilia Neas Pre-anesthesia Checklist: Patient identified, Timeout performed, Emergency Drugs available, Suction available and Patient being monitored Patient Re-evaluated:Patient Re-evaluated prior to inductionOxygen Delivery Method: Circle system utilized Preoxygenation: Pre-oxygenation with 100% oxygen Intubation Type: IV induction and Cricoid Pressure applied Ventilation: Mask ventilation without difficulty Laryngoscope Size: Mac and 4 Grade View: Grade III Tube type: Oral Tube size: 7.5 mm Number of attempts: 1 (anterior, rigid larynx. arytenoids vis with mac with cricoid/head lift) Airway Equipment and Method: Stylet Placement Confirmation: positive ETCO2 and breath sounds checked- equal and bilateral Secured at: 21 cm Tube secured with: Tape Dental Injury: Teeth and Oropharynx as per pre-operative assessment  Difficulty Due To: Difficulty was unanticipated, Difficult Airway- due to anterior larynx, Difficult Airway- due to immobile epiglottis, Difficult Airway- due to dentition and Difficult Airway- due to reduced neck mobility Future Recommendations: Recommend- induction with short-acting agent, and alternative techniques readily available

## 2014-03-18 NOTE — Transfer of Care (Signed)
Immediate Anesthesia Transfer of Care Note  Patient: Robin Yu  Procedure(s) Performed: Procedure(s): LEFT TOTAL MASTECTOMY (Left)  Patient Location: PACU  Anesthesia Type:General  Level of Consciousness: awake and patient cooperative  Airway & Oxygen Therapy: Patient Spontanous Breathing and Patient connected to face mask oxygen  Post-op Assessment: Report given to PACU RN, Post -op Vital signs reviewed and stable and Patient moving all extremities  Post vital signs: Reviewed and stable  Complications: No apparent anesthesia complications

## 2014-03-18 NOTE — Anesthesia Preprocedure Evaluation (Addendum)
Anesthesia Evaluation  Patient identified by MRN, date of birth, ID band Patient awake    Reviewed: Allergy & Precautions, H&P , NPO status , Patient's Chart, lab work & pertinent test results, reviewed documented beta blocker date and time   Airway Mallampati: II TM Distance: >3 FB Neck ROM: full    Dental no notable dental hx.    Pulmonary neg pulmonary ROS,  breath sounds clear to auscultation  Pulmonary exam normal       Cardiovascular Exercise Tolerance: Good hypertension, Pt. on medications and Pt. on home beta blockers Rhythm:regular Rate:Normal     Neuro/Psych Seizures -, Well Controlled,  Last seizure 2006 negative neurological ROS  negative psych ROS   GI/Hepatic negative GI ROS, Neg liver ROS,   Endo/Other  negative endocrine ROS  Renal/GU negative Renal ROS  negative genitourinary   Musculoskeletal   Abdominal   Peds  Hematology negative hematology ROS (+)   Anesthesia Other Findings   Reproductive/Obstetrics negative OB ROS                          Anesthesia Physical Anesthesia Plan  ASA: III  Anesthesia Plan: General   Post-op Pain Management:    Induction: Intravenous  Airway Management Planned: Oral ETT  Additional Equipment:   Intra-op Plan:   Post-operative Plan: Extubation in OR  Informed Consent: I have reviewed the patients History and Physical, chart, labs and discussed the procedure including the risks, benefits and alternatives for the proposed anesthesia with the patient or authorized representative who has indicated his/her understanding and acceptance.   Dental Advisory Given  Plan Discussed with: CRNA and Surgeon  Anesthesia Plan Comments:        Anesthesia Quick Evaluation

## 2014-03-18 NOTE — Op Note (Signed)
Preoperative Diagnosis: cancer left breast  Postoprative Diagnosis: cancer left breast  Procedure: Procedure(s): LEFT TOTAL MASTECTOMY   Surgeon: Excell Seltzer T   Assistants:  none  Anesthesia:  General endotracheal anesthesia  Indications:  The patient is a 80 year old female who presents with a proximally to a half centimeter invasive ductal carcinoma beneath the left nipple. After discussion with surgery medical oncology we have elected proceed with left simple mastectomy as initial treatment for her cancer    Procedure Detail:  Patient brought to the operating room, placed in supine position on the operating table, and general endotracheal anesthesia induced. She received preoperative IV antibiotics. PAS were in place. The left chest and breast were widely sterilely prepped and draped. The patient timeout was performed and correct procedure verified. An elliptical incision was used transversely encompassing the nipple areola complex. Skin and subcutaneous flaps were raised superiorly toward the clavicle, inferiorly toward the origin of the rectus, medially to the edge of the sternum and laterally out to the anterior border of latissimus dorsi. The breast was then reflected up off the pectoralis and serratus working medial to lateral. The specimen was dissected down to the axilla I came across the low axilla with Kelly clamps and the specimen was removed. This was tied with 3-0 Vicryl. The wound is irrigated and complete hemostasis obtained. The subcutaneous was closed with interrupted 3-0 Vicryl. Skin was closed with staples. A 19 Blake closed suction drain of the left beneath the flaps. Sponge needle and counts were correct. Dry sterile dressing was applied.   Estimated Blood Loss:  Minimal         Drains: 19 Blake  Blood Given: none          Specimens: left total mastectomy        Complications:  * No complications entered in OR log *         Disposition: PACU -  hemodynamically stable.         Condition: stable

## 2014-03-18 NOTE — Anesthesia Postprocedure Evaluation (Signed)
  Anesthesia Post-op Note  Patient: Robin Yu  Procedure(s) Performed: Procedure(s) (LRB): LEFT TOTAL MASTECTOMY (Left)  Patient Location: PACU  Anesthesia Type: General  Level of Consciousness: awake and alert   Airway and Oxygen Therapy: Patient Spontanous Breathing  Post-op Pain: mild  Post-op Assessment: Post-op Vital signs reviewed, Patient's Cardiovascular Status Stable, Respiratory Function Stable, Patent Airway and No signs of Nausea or vomiting  Last Vitals:  Filed Vitals:   03/18/14 1000  BP: 138/63  Pulse: 85  Temp: 36.5 C  Resp: 13    Post-op Vital Signs: stable   Complications: No apparent anesthesia complications

## 2014-03-19 LAB — CBC
HCT: 35.1 % — ABNORMAL LOW (ref 36.0–46.0)
Hemoglobin: 11.6 g/dL — ABNORMAL LOW (ref 12.0–15.0)
MCH: 29.1 pg (ref 26.0–34.0)
MCHC: 33 g/dL (ref 30.0–36.0)
MCV: 88.2 fL (ref 78.0–100.0)
PLATELETS: 229 10*3/uL (ref 150–400)
RBC: 3.98 MIL/uL (ref 3.87–5.11)
RDW: 13.8 % (ref 11.5–15.5)
WBC: 8.6 10*3/uL (ref 4.0–10.5)

## 2014-03-19 LAB — BASIC METABOLIC PANEL
ANION GAP: 11 (ref 5–15)
BUN: 14 mg/dL (ref 6–23)
CO2: 28 meq/L (ref 19–32)
CREATININE: 0.57 mg/dL (ref 0.50–1.10)
Calcium: 9.1 mg/dL (ref 8.4–10.5)
Chloride: 99 mEq/L (ref 96–112)
GFR calc non Af Amer: 79 mL/min — ABNORMAL LOW (ref >90)
Glucose, Bld: 104 mg/dL — ABNORMAL HIGH (ref 70–99)
Potassium: 4.5 mEq/L (ref 3.7–5.3)
SODIUM: 138 meq/L (ref 137–147)

## 2014-03-19 MED ORDER — HYDROCODONE-ACETAMINOPHEN 5-325 MG PO TABS: 1.0000 | ORAL_TABLET | ORAL | Status: DC | PRN

## 2014-03-19 NOTE — Discharge Instructions (Signed)
[facility] °[phone] ° °MASTECTOMY: POST OP INSTRUCTIONS ° °Always review your discharge instruction sheet given to you by the facility where your surgery was performed. °IF YOU HAVE DISABILITY OR FAMILY LEAVE FORMS, YOU MUST BRING THEM TO THE OFFICE FOR PROCESSING.   °DO NOT GIVE THEM TO YOUR DOCTOR. °A prescription for pain medication may be given to you upon discharge.  Take your pain medication as prescribed, if needed.  If narcotic pain medicine is not needed, then you may take acetaminophen (Tylenol) or ibuprofen (Advil) as needed. °1. Take your usually prescribed medications unless otherwise directed. °2. If you need a refill on your pain medication, please contact your pharmacy.  They will contact our office to request authorization.  Prescriptions will not be filled after 5pm or on week-ends. °3. You should follow a light diet the first few days after arrival home, such as soup and crackers, etc.  Resume your normal diet the day after surgery. °4. Most patients will experience some swelling and bruising on the chest and underarm.  Ice packs will help.  Swelling and bruising can take several days to resolve.  °5. It is common to experience some constipation if taking pain medication after surgery.  Increasing fluid intake and taking a stool softener (such as Colace) will usually help or prevent this problem from occurring.  A mild laxative (Milk of Magnesia or Miralax) should be taken according to package instructions if there are no bowel movements after 48 hours. °6. Unless discharge instructions indicate otherwise, leave your bandage dry and in place until your next appointment in 3-5 days.  You may take a limited sponge bath.  No tube baths or showers until the drains are removed.  You may have steri-strips (small skin tapes) in place directly over the incision.  These strips should be left on the skin for 7-10 days.  If your surgeon used skin glue on the incision, you may  shower in 24 hours.  The glue will flake off over the next 2-3 weeks.  Any sutures or staples will be removed at the office during your follow-up visit. °7. DRAINS:  If you have drains in place, it is important to keep a list of the amount of drainage produced each day in your drains.  Before leaving the hospital, you should be instructed on drain care.  Call our office if you have any questions about your drains. °8. ACTIVITIES:  You may resume regular (light) daily activities beginning the next day--such as daily self-care, walking, climbing stairs--gradually increasing activities as tolerated.  You may have sexual intercourse when it is comfortable.  Refrain from any heavy lifting or straining until approved by your doctor. °a. You may drive when you are no longer taking prescription pain medication, you can comfortably wear a seatbelt, and you can safely maneuver your car and apply brakes. °b. RETURN TO WORK:  __________________________________________________________ °9. You should see your doctor in the office for a follow-up appointment approximately 3-5 days after your surgery.  Your doctor’s nurse will typically make your follow-up appointment when she calls you with your pathology report.  Expect your pathology report 2-3 business days after your surgery.  You may call to check if you do not hear from us after three days.   °10. OTHER INSTRUCTIONS: ______________________________________________________________________________________________ ____________________________________________________________________________________________ °WHEN TO CALL YOUR DOCTOR: °1. Fever over 101.0 °2. Nausea and/or vomiting °3. Extreme swelling or bruising °4. Continued bleeding from incision. °5. Increased pain, redness, or drainage from the incision. °  The clinic staff is available to answer your questions during regular business hours.  Please don’t hesitate to call and ask to speak to one of the nurses for clinical  concerns.  If you have a medical emergency, go to the nearest emergency room or call 911.  A surgeon from [facility] is always on call at the hospital. °[address], [address], [place], [place]  [address] ? [address] [address], [place], [place]   [address] °[phone] ? [phone] ? FAX [phone] °Web site: [email or web address] °

## 2014-03-19 NOTE — Progress Notes (Signed)
Utilization review completed.  

## 2014-03-19 NOTE — Discharge Summary (Signed)
   Patient ID: [name] [id number] 80 y.o. 1922-07-24  03/18/2014  Discharge date and time: 03/19/2014   Admitting Physician: Excell Seltzer T  Discharge Physician: Excell Seltzer T  Admission Diagnoses: cancer left breast  Discharge Diagnoses: same  Operations: Procedure(s): LEFT TOTAL MASTECTOMY  Admission Condition: good  Discharged Condition: good  Indication for Admission: patient is a 80 year old female in very good health for her age she recently presented with a 2-1/2 cm invasive ductal carcinoma behind the left nipple. After workup and discussion of various treatment options we have elected to proceed with total mastectomy for surgical treatment of her breast cancer.  Hospital Course: patient was admitted on the morning of her procedure and underwent an uneventful left total mastectomy. She tolerated the procedure without incident. The following day she is alert and comfortable. The wound shows no swelling or evidence of bleeding. JP drainage is minimal. She is felt ready for discharge.   Disposition: Home  Patient Instructions:    Medication List         acetaminophen 500 MG tablet  Commonly known as:  TYLENOL  Take 1,000 mg by mouth every 6 (six) hours as needed for mild pain or moderate pain.     amLODipine 5 MG tablet  Commonly known as:  NORVASC  Take 5 mg by mouth every morning.     calcium carbonate 200 MG capsule  Take 200 mg by mouth 2 (two) times daily with a meal.     Cranberry 125 MG Tabs  Take 125 mg by mouth daily.     DEPAKOTE 250 MG DR tablet  Generic drug:  divalproex  Take 250-500 mg by mouth 2 (two) times daily. Takes 1 in the morning and 2 at night     HYDROcodone-acetaminophen 5-325 MG per tablet  Commonly known as:  NORCO/VICODIN  Take 1-2 tablets by mouth every 4 (four) hours as needed for moderate pain.     levothyroxine 50 MCG tablet  Commonly known as:  SYNTHROID, LEVOTHROID  Take 25 mcg by mouth daily before  breakfast.     metoprolol tartrate 25 MG tablet  Commonly known as:  LOPRESSOR  Take 25 mg by mouth 2 (two) times daily.     multivitamin with minerals Tabs tablet  Take 1 tablet by mouth daily.     POTASSIUM PO  Take 1 tablet by mouth daily.        Activity: activity as tolerated Diet: regular diet Wound Care: empty JP drain daily and record amount  Follow-up:  With Dr. Excell Seltzer in 1 week.  Signed: Edward Jolly MD, FACS  03/19/2014, 8:24 AM

## 2014-03-19 NOTE — Progress Notes (Signed)
Discharge instructions reviewed with patient and Dionicio Stall (POA/daughter) utilizing teach back method. Educated daughter on dressing changes and JP drain care and recording output daily.  Patient being discharged to home with daughter.

## 2014-03-20 ENCOUNTER — Telehealth (INDEPENDENT_AMBULATORY_CARE_PROVIDER_SITE_OTHER): Payer: Self-pay

## 2014-03-20 NOTE — Telephone Encounter (Signed)
Called pt to see how she was doing after sx. Pts daughter states that she is very well post op. Encouraged pt to give [place] a call if they need anything. Pt verbalized understanding.

## 2014-03-21 ENCOUNTER — Encounter (HOSPITAL_COMMUNITY): Payer: Self-pay | Admitting: General Surgery

## 2014-03-24 ENCOUNTER — Encounter (INDEPENDENT_AMBULATORY_CARE_PROVIDER_SITE_OTHER): Payer: Self-pay | Admitting: General Surgery

## 2014-03-24 ENCOUNTER — Ambulatory Visit (INDEPENDENT_AMBULATORY_CARE_PROVIDER_SITE_OTHER): Payer: Commercial Managed Care - HMO | Admitting: General Surgery

## 2014-03-24 VITALS — BP 140/80 | HR 72 | Temp 98.4°F | Resp 14 | Ht 67.0 in | Wt 160.2 lb

## 2014-03-24 DIAGNOSIS — Z09 Encounter for follow-up examination after completed treatment for conditions other than malignant neoplasm: Secondary | ICD-10-CM

## 2014-03-24 NOTE — Progress Notes (Signed)
Chief complaint: Followup mastectomy  History: Patient returns for followup one week following left total mastectomy. She has been somewhat tired and social work but no major problems.  Exam: BP 140/80  Pulse 72  Temp(Src) 98.4 F (36.9 C) (Oral)  Resp 14  Ht 5\' 7"  (1.702 m)  Wt 160 lb 3.2 oz (72.666 kg)  BMI 25.08 kg/m2 General: Appears well Incision: Left mastectomy wound healing nicely. JP drainage is serosanguineous. It was only 10 cc over the last 24 hours. Drain is removed.  Pathology Breast, simple mastectomy, left - TWO FOCI OF INVASIVE LOBULAR CARCINOMA, 5.8 CM AND 0.5 CM, NOTTINGHAM COMBINED HISTOLOGIC GRADE II. - SKIN: SEBORRHEIC KERATOSIS. - ONE OF THREE LYMPH NODES, POSITIVE FOR METASTATIC CARCINOMA (1/3). - RESECTION MARGINS, NEGATIVE FOR ATYPIA OR MALIGNANCY.  Assessment and plan: Doing well following mastectomy as above.  We discussed the pathology. Lymph nodes were obtained just incidentally total mastectomy. Our original plan with surgery alone with observation. I will discuss this in light of the pathology findings medical oncology to see if a referral would be in order although I think just clinical followup would be reasonable.  She will return in one week.

## 2014-03-26 ENCOUNTER — Telehealth (INDEPENDENT_AMBULATORY_CARE_PROVIDER_SITE_OTHER): Payer: Self-pay

## 2014-03-26 DIAGNOSIS — G8918 Other acute postprocedural pain: Secondary | ICD-10-CM

## 2014-03-26 MED ORDER — HYDROCODONE-ACETAMINOPHEN 5-325 MG PO TABS: 1.0000 | ORAL_TABLET | ORAL | Status: DC | PRN

## 2014-03-26 NOTE — Telephone Encounter (Signed)
Called patients daughter Silva Bandy) to make aware the refill request had been approved and will be at the front desk for pick up.  Kern Reap, JR will pick up medication for patient (ok per Silva Bandy, on [place])  Dr. Excell Seltzer aware that son-in-law will pick up rx for patient.

## 2014-03-26 NOTE — Addendum Note (Signed)
Addended by: Ivor Costa on: 03/26/2014 02:17 PM   Modules accepted: Orders

## 2014-03-26 NOTE — Telephone Encounter (Signed)
Pt s/p left mastectomy on 03/18/14 by Dr Excell Seltzer. Pts daughter is calling today to get a refill on pts Norco 5/325mg . Pt rates her pain a 8 at this time. Advised daughter that I would send a message to Dr Excell Seltzer for a refill and we would contact her as soon as we receive a message. Informed daughter if refilled they will need to come by the office to pick up Rx.  Pt verbalized understanding.

## 2014-04-02 ENCOUNTER — Encounter (INDEPENDENT_AMBULATORY_CARE_PROVIDER_SITE_OTHER): Payer: Commercial Managed Care - HMO | Admitting: General Surgery

## 2014-04-04 ENCOUNTER — Encounter (INDEPENDENT_AMBULATORY_CARE_PROVIDER_SITE_OTHER): Payer: Self-pay | Admitting: General Surgery

## 2014-04-04 ENCOUNTER — Ambulatory Visit (INDEPENDENT_AMBULATORY_CARE_PROVIDER_SITE_OTHER): Payer: Commercial Managed Care - HMO | Admitting: General Surgery

## 2014-04-04 VITALS — BP 122/80 | HR 73 | Temp 97.0°F | Ht 63.0 in | Wt 159.0 lb

## 2014-04-04 DIAGNOSIS — C50919 Malignant neoplasm of unspecified site of unspecified female breast: Secondary | ICD-10-CM

## 2014-04-04 DIAGNOSIS — G8918 Other acute postprocedural pain: Secondary | ICD-10-CM

## 2014-04-04 DIAGNOSIS — C50912 Malignant neoplasm of unspecified site of left female breast: Secondary | ICD-10-CM

## 2014-04-04 MED ORDER — HYDROCODONE-ACETAMINOPHEN 5-325 MG PO TABS: 1.0000 | ORAL_TABLET | ORAL | Status: DC | PRN

## 2014-04-04 NOTE — Progress Notes (Signed)
History: The patient returns for further followup after left total mastectomy for what proved to be pathologically T3 N1 invasive ductal carcinoma of the left breast with one of 3 nodes that came out with a simple mastectomy being positive. She is having some pain along the incision site.  Exam: BP 122/80  Pulse 73  Temp(Src) 97 F (36.1 C)  Ht 5\' 3"  (1.6 m)  Wt 159 lb (72.122 kg)  BMI 28.17 kg/m2 General: Appears well Chest wall incision is nicely healed without fluid or infection or other problems. Staples are removed. The  Assessment and plan: Recovering well from mastectomy as above. Having some discomfort but this should resolve with time. I refilled her pain medications. I discussed with the patient and her daughter the options for adjuvant hormonal therapy versus observation alone. We discussed that hormonal therapy would reduce her risk of recurrence. However she has been very reluctant to take medication that could have any side effect and at age 80 I do not think it would be unreasonable to opt for observation alone and start hormonal therapy if she were to have a recurrence. They understand these issues well and she does not want hormonal treatment. We will therefore plan observation alone which was our original plan was for simple mastectomy. She is given instructions for R. Range of motion exercises. Return in one month.

## 2014-04-10 ENCOUNTER — Telehealth (INDEPENDENT_AMBULATORY_CARE_PROVIDER_SITE_OTHER): Payer: Self-pay | Admitting: *Deleted

## 2014-04-10 NOTE — Telephone Encounter (Signed)
Mrs. Robin Yu called regarding pt.  She is s/p left masty 03-18-14.  She has already gotten a refill of her Hydrocodone 5-325 on 04-04-14.  She states pt is still in a lot of pain and is requesting another refill.  I advised [name] that I would have to get authorization from Dr. Excell Seltzer and me or his assistant would give her a call back and also I let her know that it could take up to 24 hours.  She verbalized understanding.  Please advise!  Anderson Malta

## 2014-04-11 ENCOUNTER — Other Ambulatory Visit (INDEPENDENT_AMBULATORY_CARE_PROVIDER_SITE_OTHER): Payer: Self-pay

## 2014-04-11 DIAGNOSIS — G8918 Other acute postprocedural pain: Secondary | ICD-10-CM

## 2014-04-11 MED ORDER — HYDROCODONE-ACETAMINOPHEN 5-325 MG PO TABS: 1.0000 | ORAL_TABLET | ORAL | Status: DC | PRN

## 2014-04-11 NOTE — Telephone Encounter (Signed)
Okay to refill hydrocodone

## 2014-04-11 NOTE — Telephone Encounter (Signed)
Informed pt that Norco Rx was written and will be ready to pick up at the front desk. Pt verbalized understanding

## 2014-04-22 ENCOUNTER — Telehealth (INDEPENDENT_AMBULATORY_CARE_PROVIDER_SITE_OTHER): Payer: Self-pay

## 2014-04-22 ENCOUNTER — Other Ambulatory Visit (INDEPENDENT_AMBULATORY_CARE_PROVIDER_SITE_OTHER): Payer: Self-pay

## 2014-04-22 MED ORDER — TRAMADOL HCL 50 MG PO TABS: 50.0000 mg | ORAL_TABLET | Freq: Four times a day (QID) | ORAL | Status: AC | PRN

## 2014-04-22 NOTE — Telephone Encounter (Signed)
How about Tramadol one q 6h prn # 30

## 2014-04-22 NOTE — Telephone Encounter (Signed)
Informed daughter a Tramadol Rx will be at the front desk ready for pick up

## 2014-04-22 NOTE — Telephone Encounter (Signed)
Pt s/p left mastectomy on 03/18/14 by Dr Excell Seltzer. Pts daughter is calling today to get a refill on pts pain medication. Pt has been getting Norco however she would like to try something not as strong this time.  Pt rates her pain a 5 at this time. Advised daughter that I would send a message to Dr Excell Seltzer for a refill and we would contact her as soon as we receive a message. Informed daughter if refilled they will need to come by the office to pick up Rx. Pt verbalized understanding.

## 2014-04-30 ENCOUNTER — Encounter (INDEPENDENT_AMBULATORY_CARE_PROVIDER_SITE_OTHER): Payer: Self-pay | Admitting: General Surgery

## 2014-04-30 ENCOUNTER — Ambulatory Visit (INDEPENDENT_AMBULATORY_CARE_PROVIDER_SITE_OTHER): Payer: Commercial Managed Care - HMO | Admitting: General Surgery

## 2014-04-30 ENCOUNTER — Other Ambulatory Visit (INDEPENDENT_AMBULATORY_CARE_PROVIDER_SITE_OTHER): Payer: Self-pay

## 2014-04-30 VITALS — BP 124/82 | HR 73 | Temp 97.6°F | Ht 63.0 in | Wt 154.0 lb

## 2014-04-30 DIAGNOSIS — R208 Other disturbances of skin sensation: Secondary | ICD-10-CM

## 2014-04-30 DIAGNOSIS — Z09 Encounter for follow-up examination after completed treatment for conditions other than malignant neoplasm: Secondary | ICD-10-CM

## 2014-04-30 NOTE — Patient Instructions (Signed)
Use Florastor or any probiotic.  I will call you regarding the urinalysis

## 2014-04-30 NOTE — Progress Notes (Signed)
History: Patient returns for more long-term followup after left total mastectomy for T3 N1 cancer of the left breast. As previously noted we have decided on observation only. She reports no problems related to her chest wall or arm. She is doing her exercises. Her daughter reports she has had couple of episodes of diarrhea and she was worried about C. Difficile that she has had previously. She has only had 2 episodes of diarrhea in the last month. Also she is having a little bit of lower abdominal discomfort and urinary burning the last day or 2.  Exam: BP 124/82  Pulse 73  Temp(Src) 97.6 F (36.4 C)  Ht 5\' 3"  (1.6 m)  Wt 154 lb (69.854 kg)  BMI 27.29 kg/m2 General: Appears well Chest wall: Incision well healed without complications. No arm edema Abdomen: Nontender  Assessment and plan: Doing well following mastectomy. Plan clinical follow up in 6 months. I will check a urinalysis and call with the results. If her symptoms continue she will need to follow up with primary care. I recommended a probiotic and observation for her to intermittent episodes of diarrhea and to call if this persists or gets any worse.

## 2014-05-02 ENCOUNTER — Telehealth (INDEPENDENT_AMBULATORY_CARE_PROVIDER_SITE_OTHER): Payer: Self-pay

## 2014-05-02 NOTE — Telephone Encounter (Signed)
Preliminary urine culture in epic faxed to Dr Deforest Hoyles per MD request.

## 2014-05-03 LAB — URINE CULTURE: Colony Count: 70000

## 2014-05-19 ENCOUNTER — Other Ambulatory Visit: Payer: Self-pay | Admitting: Internal Medicine

## 2014-05-19 DIAGNOSIS — R1084 Generalized abdominal pain: Secondary | ICD-10-CM

## 2014-05-23 ENCOUNTER — Ambulatory Visit
Admission: RE | Admit: 2014-05-23 | Discharge: 2014-05-23 | Disposition: A | Discharge status: Home / Self Care | Payer: Commercial Managed Care - HMO | Source: Ambulatory Visit | Attending: Internal Medicine | Admitting: Internal Medicine

## 2014-05-23 DIAGNOSIS — R1084 Generalized abdominal pain: Secondary | ICD-10-CM

## 2014-05-23 MED ORDER — IOHEXOL 300 MG/ML  SOLN
100.0000 mL | Freq: Once | INTRAMUSCULAR | Status: AC | PRN
  Administered 2014-05-23: 100 mL via INTRAVENOUS

## 2014-10-17 ENCOUNTER — Other Ambulatory Visit: Payer: Self-pay | Admitting: Physician Assistant

## 2015-01-11 ENCOUNTER — Emergency Department (HOSPITAL_COMMUNITY): Payer: Commercial Managed Care - HMO

## 2015-01-11 ENCOUNTER — Emergency Department (HOSPITAL_COMMUNITY)
Admission: EM | Admit: 2015-01-11 | Discharge: 2015-01-11 | Disposition: A | Discharge status: Home / Self Care | Payer: Commercial Managed Care - HMO | Attending: Emergency Medicine | Admitting: Emergency Medicine

## 2015-01-11 ENCOUNTER — Encounter (HOSPITAL_COMMUNITY): Payer: Self-pay

## 2015-01-11 DIAGNOSIS — E079 Disorder of thyroid, unspecified: Secondary | ICD-10-CM | POA: Diagnosis not present

## 2015-01-11 DIAGNOSIS — G40909 Epilepsy, unspecified, not intractable, without status epilepticus: Secondary | ICD-10-CM | POA: Insufficient documentation

## 2015-01-11 DIAGNOSIS — I10 Essential (primary) hypertension: Secondary | ICD-10-CM | POA: Diagnosis not present

## 2015-01-11 DIAGNOSIS — Y998 Other external cause status: Secondary | ICD-10-CM | POA: Diagnosis not present

## 2015-01-11 DIAGNOSIS — M1990 Unspecified osteoarthritis, unspecified site: Secondary | ICD-10-CM | POA: Diagnosis not present

## 2015-01-11 DIAGNOSIS — Y9289 Other specified places as the place of occurrence of the external cause: Secondary | ICD-10-CM | POA: Diagnosis not present

## 2015-01-11 DIAGNOSIS — S93402A Sprain of unspecified ligament of left ankle, initial encounter: Secondary | ICD-10-CM | POA: Diagnosis not present

## 2015-01-11 DIAGNOSIS — Z79899 Other long term (current) drug therapy: Secondary | ICD-10-CM | POA: Insufficient documentation

## 2015-01-11 DIAGNOSIS — Z87440 Personal history of urinary (tract) infections: Secondary | ICD-10-CM | POA: Insufficient documentation

## 2015-01-11 DIAGNOSIS — S99912A Unspecified injury of left ankle, initial encounter: Secondary | ICD-10-CM | POA: Diagnosis present

## 2015-01-11 DIAGNOSIS — Z853 Personal history of malignant neoplasm of breast: Secondary | ICD-10-CM | POA: Insufficient documentation

## 2015-01-11 DIAGNOSIS — W010XXA Fall on same level from slipping, tripping and stumbling without subsequent striking against object, initial encounter: Secondary | ICD-10-CM | POA: Diagnosis not present

## 2015-01-11 DIAGNOSIS — H9190 Unspecified hearing loss, unspecified ear: Secondary | ICD-10-CM | POA: Insufficient documentation

## 2015-01-11 DIAGNOSIS — Z880 Allergy status to penicillin: Secondary | ICD-10-CM | POA: Diagnosis not present

## 2015-01-11 DIAGNOSIS — Y9389 Activity, other specified: Secondary | ICD-10-CM | POA: Diagnosis not present

## 2015-01-11 NOTE — ED Provider Notes (Signed)
CSN: [id number]     Arrival date & time 01/11/15  1819 History   First MD Initiated Contact with Patient 01/11/15 1831     Chief Complaint  Patient presents with  . Fall  . Ankle Injury    left ankle     (Consider location/radiation/quality/duration/timing/severity/associated sxs/prior Treatment) HPI Patient tripped and fell on a step 1 PM today injuring her left ankle. She felt well prior to the event. She complains of left lateral ankle pain, which is nonradiating. Pain is worse with walking improved with remaining still. She has been ambulatory since the event. No other associated symptoms. No treatment prior to coming here. Past Medical History  Diagnosis Date  . Arthritis   . Hypertension   . Thyroid disease   . Seizures     LAST SEIZURE 2006  . Hx of jaundice     as teenager  . Hx: UTI (urinary tract infection)   . Cancer   . Breast cancer     left   Past Surgical History  Procedure Laterality Date  . Hip arthroplasty  2008    LEFT  . Total abdominal hysterectomy  2001  . Tonsillectomy  1940  . Bladder repair  2000  . Toe nail  2010  . Total mastectomy Left 03/18/2014    Procedure: LEFT TOTAL MASTECTOMY;  Surgeon: Edward Jolly, MD;  Location: [facility];  Service: General;  Laterality: Left;   Family History  Problem Relation Age of Onset  . Stroke Father    History  Substance Use Topics  . Smoking status: Never Smoker   . Smokeless tobacco: Not on file  . Alcohol Use: No   OB History    No data available     Review of Systems  Constitutional: Negative.   HENT: Positive for hearing loss.        Hard of hearing chronically  Musculoskeletal: Positive for arthralgias.       Left ankle pain      Allergies  Penicillins  Home Medications   Prior to Admission medications   Medication Sig Start Date End Date Taking? Authorizing Provider  acetaminophen (TYLENOL) 500 MG tablet Take 1,000 mg by mouth every 6 (six) hours as needed for mild pain or  moderate pain.    Historical Provider, MD  amLODipine (NORVASC) 5 MG tablet Take 5 mg by mouth every morning.     Historical Provider, MD  calcium carbonate 200 MG capsule Take 200 mg by mouth 2 (two) times daily with a meal.     Historical Provider, MD  Cranberry 125 MG TABS Take 125 mg by mouth daily.     Historical Provider, MD  divalproex (DEPAKOTE) 250 MG DR tablet Take 250-500 mg by mouth 2 (two) times daily. Takes 1 in the morning and 2 at night    Historical Provider, MD  levothyroxine (SYNTHROID, LEVOTHROID) 50 MCG tablet Take 25 mcg by mouth daily before breakfast.    Historical Provider, MD  metoprolol tartrate (LOPRESSOR) 25 MG tablet Take 25 mg by mouth 2 (two) times daily.    Historical Provider, MD  Multiple Vitamin (MULTIVITAMIN WITH MINERALS) TABS tablet Take 1 tablet by mouth daily.    Historical Provider, MD  polycarbophil (FIBERCON) 625 MG tablet Take 625 mg by mouth daily.    Historical Provider, MD  POTASSIUM PO Take 1 tablet by mouth daily.    Historical Provider, MD  traMADol (ULTRAM) 50 MG tablet Take 1 tablet (50 mg total) by mouth  every 6 (six) hours as needed. 04/22/14 04/22/15  Greer Pickerel, MD  vitamin B-12 (CYANOCOBALAMIN) 100 MCG tablet Take 100 mcg by mouth daily.    Historical Provider, MD   There were no vitals taken for this visit. Physical Exam  Constitutional: She is oriented to person, place, and time. She appears well-developed and well-nourished.  HENT:  Head: Normocephalic and atraumatic.  Eyes: Conjunctivae are normal. Pupils are equal, round, and reactive to light.  Neck: Neck supple. No tracheal deviation present. No thyromegaly present.  Cardiovascular: Normal rate and regular rhythm.   No murmur heard. Pulmonary/Chest: Effort normal and breath sounds normal.  Abdominal: Soft. Bowel sounds are normal. She exhibits no distension. There is no tenderness.  Musculoskeletal: Normal range of motion. She exhibits no edema or tenderness.  Left lower  extremity skin intact. Mildly swollen and tender over lateral malleolus. EP pulses 2+. Good capillary refill. Negative Thompson's test. Pelvis stable nontender.. No pain on internal or external rotation of either thigh. All other extremities without contusion abrasion or tenderness neurovascularly intact  Neurological: She is alert and oriented to person, place, and time. Coordination normal.  Skin: Skin is warm and dry. No rash noted.  Psychiatric: She has a normal mood and affect.  Nursing note and vitals reviewed.   ED Course  Procedures (including critical care time) Labs Review Labs Reviewed - No data to display  Imaging Review No results found.   EKG Interpretation None     Declines pain medicine  X-ray viewed by me. ASO ankle splint placed on patient, which patient finds comfortable. She walks with minimal limp favoring left leg after splint applied Results for orders placed or performed in visit on 04/30/14  Urine Culture  Result Value Ref Range   Culture ESCHERICHIA COLI    Colony Count 70,000 COLONIES/ML    Organism ID, Bacteria ESCHERICHIA COLI       Susceptibility   Escherichia coli -  (no method available)    AMPICILLIN >=32 Resistant     AMOX/CLAVULANIC 4 Sensitive     AMPICILLIN/SULBACTAM 16 Intermediate     PIP/TAZO <=4 Sensitive     IMIPENEM <=0.25 Sensitive     CEFAZOLIN <=4 Sensitive     CEFTRIAXONE <=1 Sensitive     CEFTAZIDIME <=1 Sensitive     CEFEPIME <=1 Sensitive     GENTAMICIN <=1 Sensitive     TOBRAMYCIN <=1 Sensitive     CIPROFLOXACIN <=0.25 Sensitive     LEVOFLOXACIN <=0.12 Sensitive     NITROFURANTOIN <=16 Sensitive     TRIMETH/SULFA <=20 Sensitive    Dg Ankle Complete Left  01/11/2015   CLINICAL DATA:  Twisting injury with lateral ankle pain  EXAM: LEFT ANKLE COMPLETE - 3+ VIEW  COMPARISON:  None.  FINDINGS: There is no evidence of fracture, dislocation, or joint effusion. There is no evidence of arthropathy or other focal bone  abnormality. Soft tissues are unremarkable.  IMPRESSION: No acute abnormality is noted.   Electronically Signed   By: Inez Catalina M.D.   On: 01/11/2015 19:06    MDM  Plan referral Dr.XU if needed one week. Tylenol for pain Diagnosis #1 fall  #2 left ankle sprain  Final diagnoses:  None        Orlie Dakin, MD 01/11/15 2116

## 2015-01-11 NOTE — ED Notes (Signed)
Pt in XR. 

## 2015-01-11 NOTE — ED Notes (Signed)
MD at bedside. 

## 2015-01-11 NOTE — ED Notes (Signed)
Pt tripped on step landing on left ankle at 1pm. Pt continues to c/o of left ankle pain.pt denies any other complaints. Slight swelling to left ankle. Increased pain with standing and walking

## 2015-01-11 NOTE — Discharge Instructions (Signed)
Wear the splint as needed for comfort. Take Tylenol as directed for pain. Call Dr.Xu if continuing to have significant pain or difficulty walking in one week Ankle Sprain An ankle sprain is an injury to the strong, fibrous tissues (ligaments) that hold your ankle bones together.  HOME CARE   Put ice on your ankle for 1-2 days or as told by your doctor.  Put ice in a plastic bag.  Place a towel between your skin and the bag.  Leave the ice on for 15-20 minutes at a time, every 2 hours while you are awake.  Only take medicine as told by your doctor.  Raise (elevate) your injured ankle above the level of your heart as much as possible for 2-3 days.  Use crutches if your doctor tells you to. Slowly put your own weight on the affected ankle. Use the crutches until you can walk without pain.  If you have a plaster splint:  Do not rest it on anything harder than a pillow for 24 hours.  Do not put weight on it.  Do not get it wet.  Take it off to shower or bathe.  If given, use an elastic wrap or support stocking for support. Take the wrap off if your toes lose feeling (numb), tingle, or turn cold or blue.  If you have an air splint:  Add or let out air to make it comfortable.  Take it off at night and to shower and bathe.  Wiggle your toes and move your ankle up and down often while you are wearing it. GET HELP IF:  You have rapidly increasing bruising or puffiness (swelling).  Your toes feel very cold.  You lose feeling in your foot.  Your medicine does not help your pain. GET HELP RIGHT AWAY IF:   Your toes lose feeling (numb) or turn blue.  You have severe pain that is increasing. MAKE SURE YOU:   Understand these instructions.  Will watch your condition.  Will get help right away if you are not doing well or get worse. Document Released: 02/08/2008 Document Revised: 01/06/2014 Document Reviewed: 03/05/2012 [facility] Patient Information 2015 [place], [place].  This information is not intended to replace advice given to you by your health care provider. Make sure you discuss any questions you have with your health care provider.

## 2015-03-04 ENCOUNTER — Other Ambulatory Visit: Payer: Self-pay | Admitting: Internal Medicine

## 2015-03-04 DIAGNOSIS — R109 Unspecified abdominal pain: Secondary | ICD-10-CM

## 2015-03-06 ENCOUNTER — Ambulatory Visit
Admission: RE | Admit: 2015-03-06 | Discharge: 2015-03-06 | Disposition: A | Discharge status: Home / Self Care | Payer: Commercial Managed Care - HMO | Source: Ambulatory Visit | Attending: Internal Medicine | Admitting: Internal Medicine

## 2015-03-06 DIAGNOSIS — R109 Unspecified abdominal pain: Secondary | ICD-10-CM

## 2015-04-08 ENCOUNTER — Other Ambulatory Visit: Payer: Self-pay | Admitting: Physician Assistant

## 2015-04-08 DIAGNOSIS — R1032 Left lower quadrant pain: Secondary | ICD-10-CM

## 2015-04-10 ENCOUNTER — Ambulatory Visit
Admission: RE | Admit: 2015-04-10 | Discharge: 2015-04-10 | Disposition: A | Discharge status: Home / Self Care | Payer: Commercial Managed Care - HMO | Source: Ambulatory Visit | Attending: Physician Assistant | Admitting: Physician Assistant

## 2015-04-10 DIAGNOSIS — R1032 Left lower quadrant pain: Secondary | ICD-10-CM

## 2015-04-10 MED ORDER — IOPAMIDOL (ISOVUE-300) INJECTION 61%
100.0000 mL | Freq: Once | INTRAVENOUS | Status: AC | PRN
  Administered 2015-04-10: 100 mL via INTRAVENOUS

## 2015-04-22 ENCOUNTER — Other Ambulatory Visit: Payer: Self-pay | Admitting: Physician Assistant

## 2015-04-22 DIAGNOSIS — K5780 Diverticulitis of intestine, part unspecified, with perforation and abscess without bleeding: Secondary | ICD-10-CM

## 2015-04-27 ENCOUNTER — Ambulatory Visit
Admission: RE | Admit: 2015-04-27 | Discharge: 2015-04-27 | Disposition: A | Discharge status: Home / Self Care | Payer: Commercial Managed Care - HMO | Source: Ambulatory Visit | Attending: Physician Assistant | Admitting: Physician Assistant

## 2015-04-27 DIAGNOSIS — K5780 Diverticulitis of intestine, part unspecified, with perforation and abscess without bleeding: Secondary | ICD-10-CM

## 2015-04-27 MED ORDER — IOPAMIDOL (ISOVUE-300) INJECTION 61%
100.0000 mL | Freq: Once | INTRAVENOUS | Status: AC | PRN
  Administered 2015-04-27: 100 mL via INTRAVENOUS

## 2015-08-16 IMAGING — MG MM DIGITAL DIAGNOSTIC BILAT CAD
4 series · 4 of 4 positions shown · non-contrast
Comparison: Prior screening mammograms from 07/14/2008 and
06/11/2007.

CLINICAL DATA: [AGE] female with a palpable abnormality in
the left breast.

EXAM:
DIGITAL DIAGNOSTIC  BILATERAL MAMMOGRAM WITH CAD
ULTRASOUND BILATERAL BREAST

[R CC]
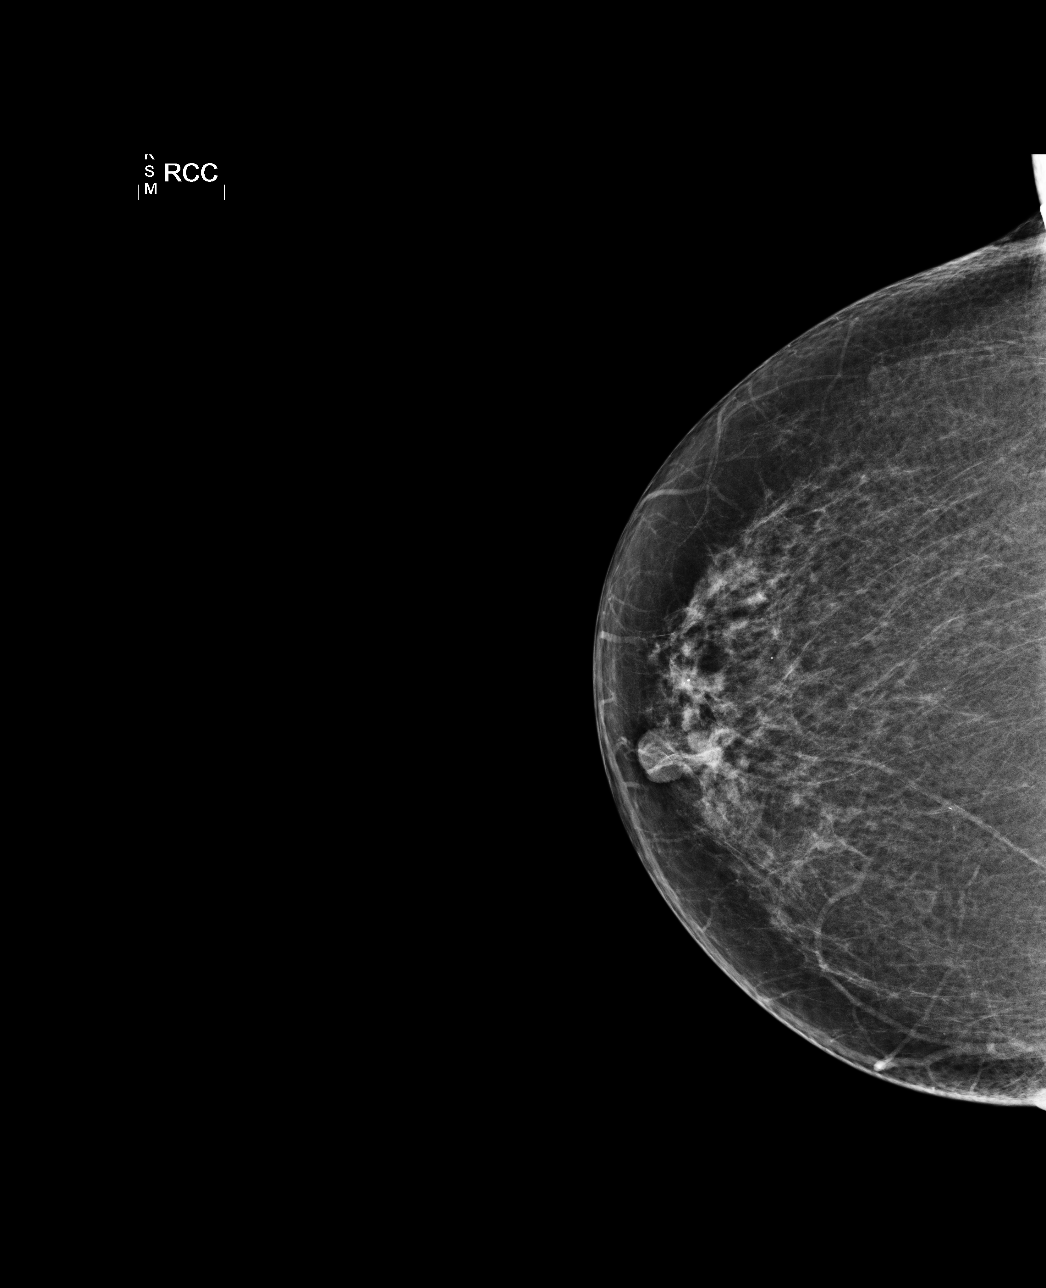

[L CC]
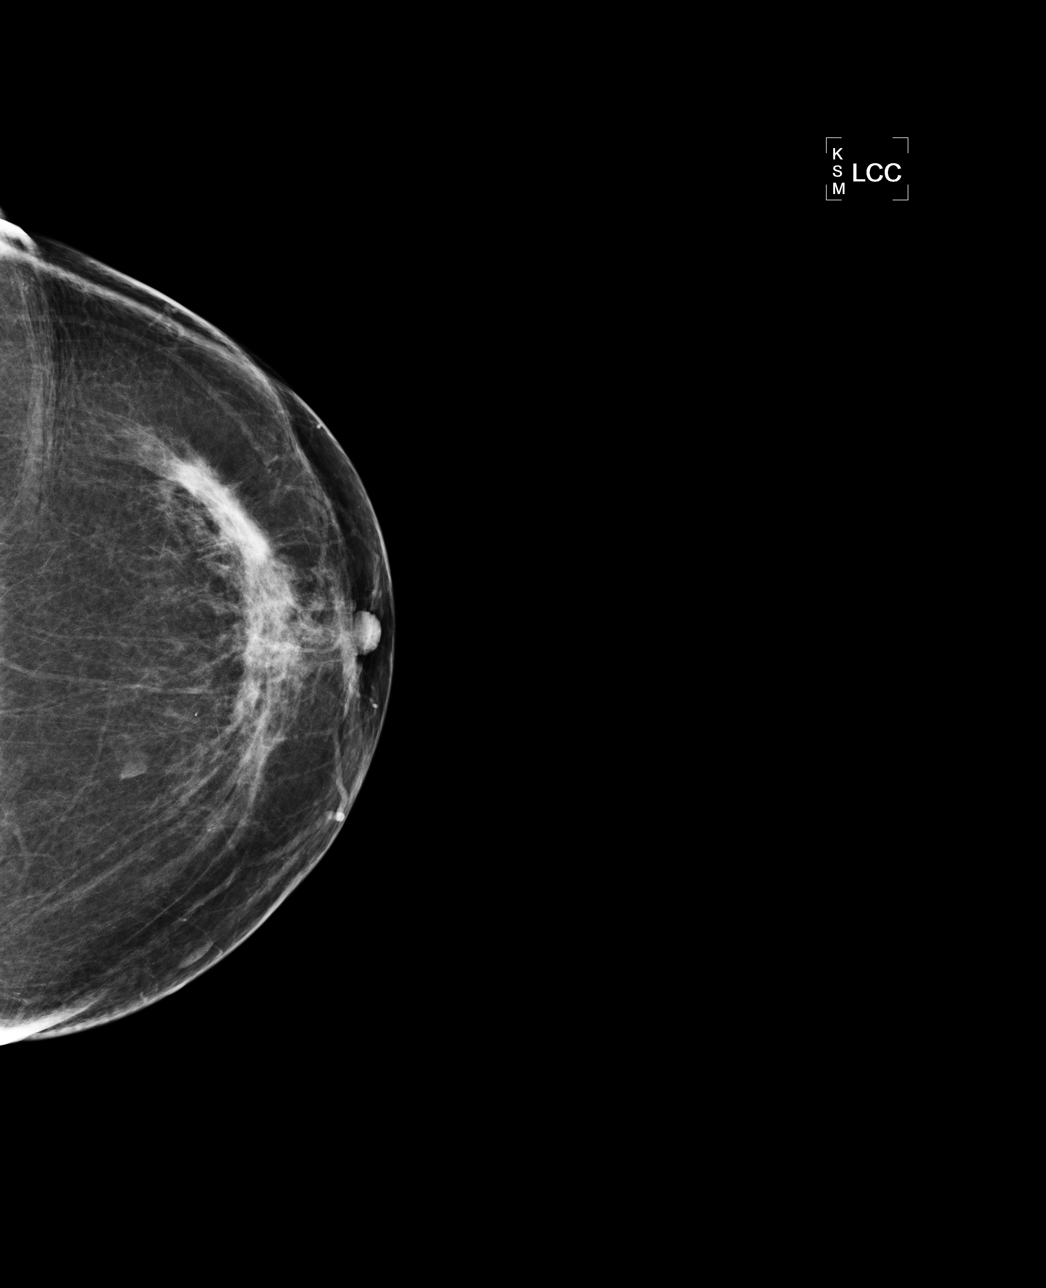

[L MLO]
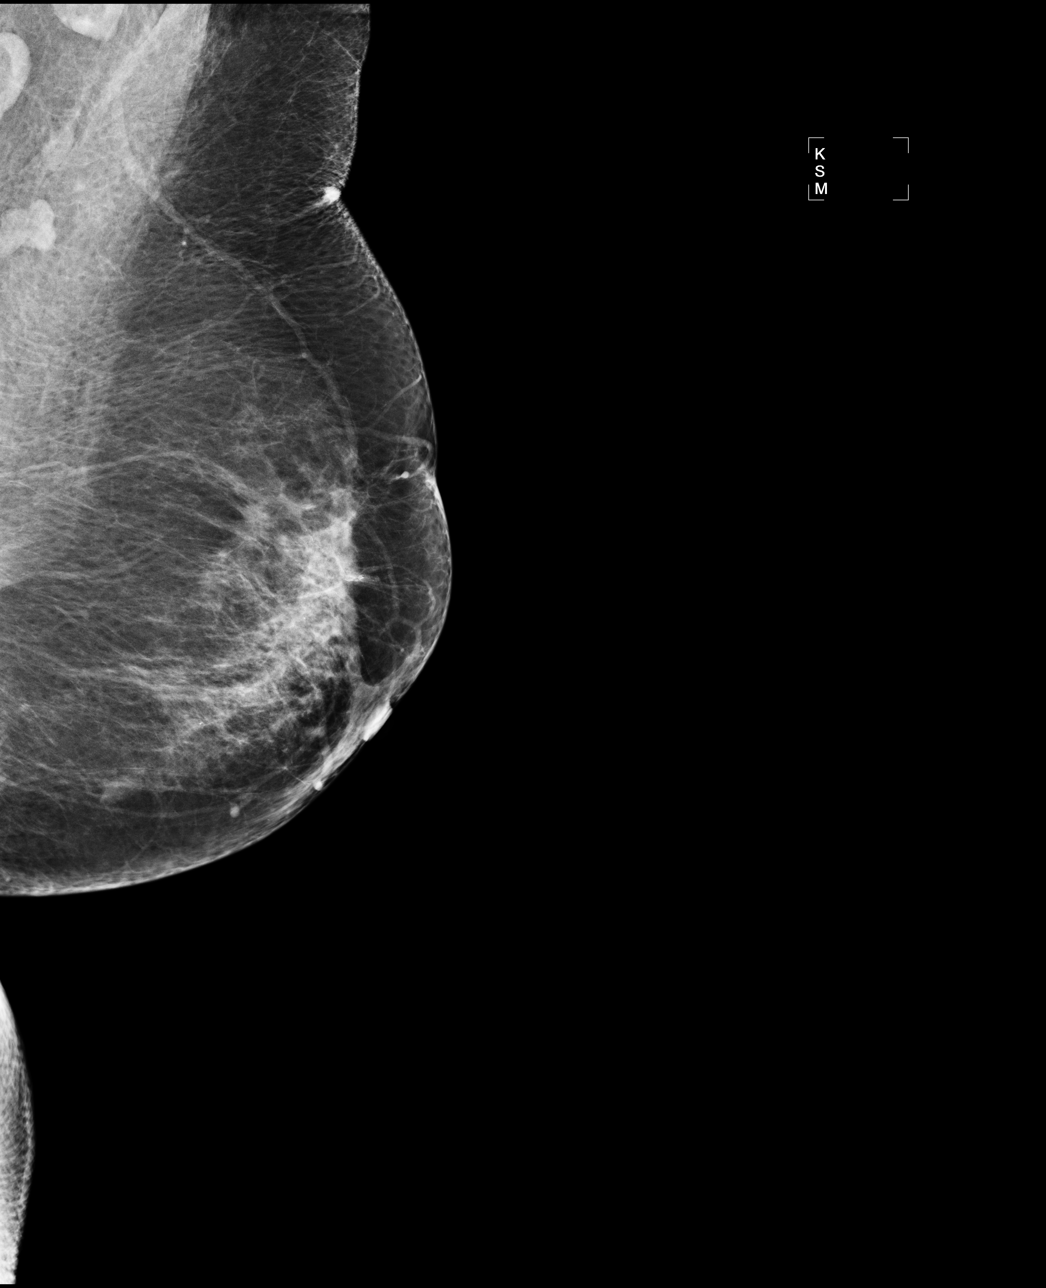

[R MLO]
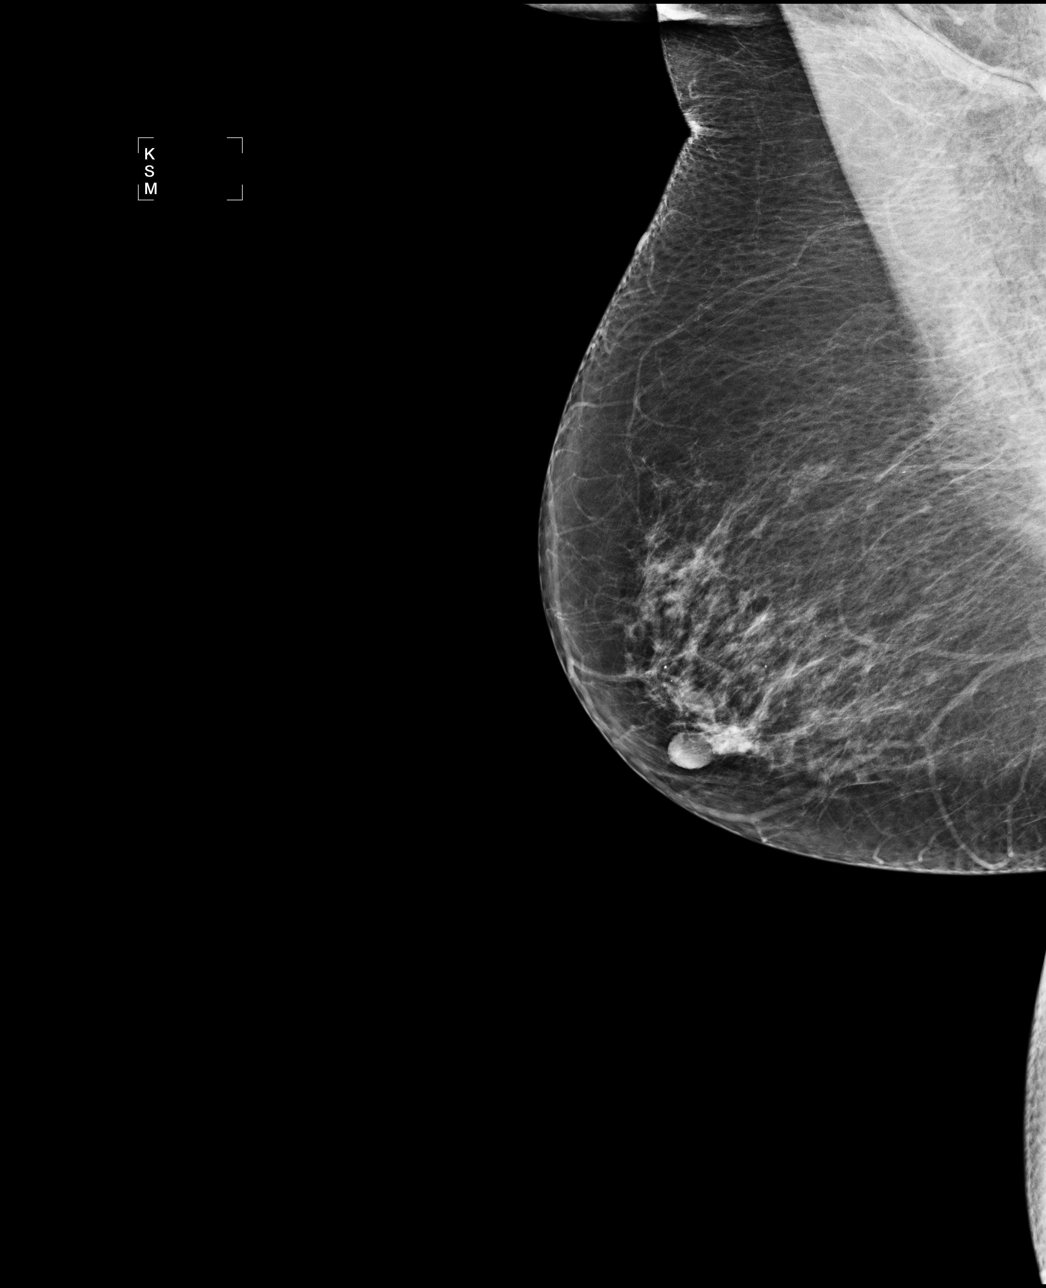

[4 of 4 positions shown; findings below may reference images not displayed]

ACR Breast Density Category b: There are scattered areas of
fibroglandular density.
FINDINGS: There is architectural distortion with increased density in the
subareolar left breast at site of the patient's palpable concern.
There is retraction of the left nipple. There are no suspicious
masses or calcifications seen in the right breast.

Mammographic images were processed with CAD.

Physical examination at site of palpable concern in the left breast
reveals a firm 6 mass at the approximate 12 o'clock position.

Targeted ultrasound of the left breast was performed demonstrating
an irregular shadowing mass at 12 o'clock 1 cm from the nipple
measuring 2.8 x 1.5 x 2.5 cm. This corresponds with mammography
findings. No lymphadenopathy is seen in the left axilla.
IMPRESSION: Highly suspicious mass in the left breast at 12 o'clock.

RECOMMENDATION:
Ultrasound-guided biopsy of a highly suspicious mass in the left
breast is recommended. This is scheduled on [REDACTED] February 14, 2014.

I have discussed the findings and recommendations with the patient.
Results were also provided in writing at the conclusion of the
visit. If applicable, a reminder letter will be sent to the patient
regarding the next appointment.

BI-RADS CATEGORY  5: Highly suggestive of malignancy.

## 2015-09-16 ENCOUNTER — Ambulatory Visit
Admission: RE | Admit: 2015-09-16 | Discharge: 2015-09-16 | Disposition: A | Discharge status: Home / Self Care | Payer: Commercial Managed Care - HMO | Source: Ambulatory Visit | Attending: Internal Medicine | Admitting: Internal Medicine

## 2015-09-16 ENCOUNTER — Other Ambulatory Visit: Payer: Self-pay | Admitting: Internal Medicine

## 2015-09-16 DIAGNOSIS — R601 Generalized edema: Secondary | ICD-10-CM

## 2015-09-27 ENCOUNTER — Emergency Department (HOSPITAL_COMMUNITY)
Admission: EM | Admit: 2015-09-27 | Discharge: 2015-09-27 | Disposition: A | Discharge status: Home / Self Care | Payer: Commercial Managed Care - HMO | Attending: Emergency Medicine | Admitting: Emergency Medicine

## 2015-09-27 ENCOUNTER — Emergency Department (HOSPITAL_COMMUNITY): Payer: Commercial Managed Care - HMO

## 2015-09-27 DIAGNOSIS — Z792 Long term (current) use of antibiotics: Secondary | ICD-10-CM | POA: Insufficient documentation

## 2015-09-27 DIAGNOSIS — Z880 Allergy status to penicillin: Secondary | ICD-10-CM | POA: Insufficient documentation

## 2015-09-27 DIAGNOSIS — Z87440 Personal history of urinary (tract) infections: Secondary | ICD-10-CM | POA: Diagnosis not present

## 2015-09-27 DIAGNOSIS — E079 Disorder of thyroid, unspecified: Secondary | ICD-10-CM | POA: Diagnosis not present

## 2015-09-27 DIAGNOSIS — I10 Essential (primary) hypertension: Secondary | ICD-10-CM | POA: Diagnosis not present

## 2015-09-27 DIAGNOSIS — Z79899 Other long term (current) drug therapy: Secondary | ICD-10-CM | POA: Diagnosis not present

## 2015-09-27 DIAGNOSIS — M1990 Unspecified osteoarthritis, unspecified site: Secondary | ICD-10-CM | POA: Diagnosis not present

## 2015-09-27 DIAGNOSIS — L03115 Cellulitis of right lower limb: Secondary | ICD-10-CM | POA: Diagnosis not present

## 2015-09-27 DIAGNOSIS — Z853 Personal history of malignant neoplasm of breast: Secondary | ICD-10-CM | POA: Diagnosis not present

## 2015-09-27 MED ORDER — TRAMADOL HCL 50 MG PO TABS
50.0000 mg | ORAL_TABLET | Freq: Once | ORAL | Status: AC
  Administered 2015-09-27: 50 mg via ORAL
  Filled 2015-09-27: qty 1

## 2015-09-27 MED ORDER — DOXYCYCLINE HYCLATE 100 MG PO CAPS: 100.0000 mg | ORAL_CAPSULE | Freq: Two times a day (BID) | ORAL | Status: AC

## 2015-09-27 MED ORDER — DOXYCYCLINE HYCLATE 100 MG PO TABS
100.0000 mg | ORAL_TABLET | Freq: Once | ORAL | Status: AC
  Administered 2015-09-27: 100 mg via ORAL
  Filled 2015-09-27: qty 1

## 2015-09-27 MED ORDER — TRAMADOL HCL 50 MG PO TABS: 50.0000 mg | ORAL_TABLET | Freq: Four times a day (QID) | ORAL | Status: AC | PRN

## 2015-09-27 MED ORDER — LIDOCAINE HCL 1 % IJ SOLN
20.0000 mL | Freq: Once | INTRAMUSCULAR | Status: AC
  Administered 2015-09-27: 20 mL via INTRADERMAL
  Filled 2015-09-27: qty 20

## 2015-09-27 NOTE — ED Notes (Addendum)
Pt had cellulitis to rt leg for approx 2 weeks, had swelling to ankle but now the swelling has gone down she is concerned that she has a fracture. Pain with walking not able to wear weight. Elevation makes it better. Took tramadol for pain. Has completed the abx. No fevers has also had to ultrasounds on leg since xmas to r/o a clot.

## 2015-09-27 NOTE — ED Provider Notes (Signed)
CSN: [id number]     Arrival date & time 09/27/15  1404 History   First MD Initiated Contact with Patient 09/27/15 1944     Chief Complaint  Patient presents with  . Ankle Pain  . Leg Swelling    HPI   Robin Yu is a 82 y.o. female with a PMH of HTN, breast cancer, arthritis who presents to the ED with right ankle pain. She reports she was treated for bilateral lower extremity cellulitis around Christmas, and states her redness and swelling initially improved. 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 She denies hitting her head, LOC, or additional injury. She reports her symptoms are constant. She states movement exacerbates her pain. She has tried her tramadol and ibuprofen with some symptom relief. She denies fever, chills, numbness, weakness.   Past Medical History  Diagnosis Date  . Arthritis   . Hypertension   . Thyroid disease   . Seizures     LAST SEIZURE 2006  . Hx of jaundice     as teenager  . Hx: UTI (urinary tract infection)   . Cancer   . Breast cancer     left   Past Surgical History  Procedure Laterality Date  . Hip arthroplasty  2008    LEFT  . Total abdominal hysterectomy  2001  . Tonsillectomy  1940  . Bladder repair  2000  . Toe nail  2010  . Total mastectomy Left 03/18/2014    Procedure: LEFT TOTAL MASTECTOMY;  Surgeon: Edward Jolly, MD;  Location: [facility];  Service: General;  Laterality: Left;   Family History  Problem Relation Age of Onset  . Stroke Father    Social History  Substance Use Topics  . Smoking status: Never Smoker   . Smokeless tobacco: Not on file  . Alcohol Use: No   OB History    No data available       Review of Systems  Constitutional: Negative for fever and chills.  Musculoskeletal: Positive for arthralgias.  Skin: Positive for color change.  Neurological: Negative for weakness  and numbness.  All other systems reviewed and are negative.     Allergies  Penicillins  Home Medications   Prior to Admission medications   Medication Sig Start Date End Date Taking? Authorizing Provider  amLODipine (NORVASC) 5 MG tablet Take 5 mg by mouth every morning.    Yes Historical Provider, MD  Calcium Carb-Cholecalciferol (CALCIUM-VITAMIN D) 500-400 MG-UNIT TABS Take 1 tablet by mouth daily.   Yes Historical Provider, MD  Cranberry 125 MG TABS Take 125 mg by mouth 2 (two) times daily.    Yes Historical Provider, MD  divalproex (DEPAKOTE) 250 MG DR tablet Take 250-500 mg by mouth 2 (two) times daily. Takes 1 in the morning and 2 at night   Yes Historical Provider, MD  furosemide (LASIX) 20 MG tablet Take 20 mg by mouth 2 (two) times daily.   Yes Historical Provider, MD  ibuprofen (ADVIL,MOTRIN) 200 MG tablet Take 600 mg by mouth every 6 (six) hours as needed for moderate pain.   Yes Historical Provider, MD  Ibuprofen-Diphenhydramine Cit (ADVIL PM PO) Take 1 tablet by mouth at bedtime as needed (sleep).   Yes Historical Provider, MD  levothyroxine (SYNTHROID, LEVOTHROID) 25 MCG tablet Take 25 mcg by mouth daily before breakfast.   Yes Historical Provider, MD  Melatonin 3 MG TABS Take 1 tablet by mouth at bedtime as needed (sleep). Take with food   Yes Historical Provider, MD  metoprolol tartrate (LOPRESSOR) 25 MG tablet Take 25 mg by mouth 2 (two) times daily.   Yes Historical Provider, MD  Multiple Vitamin (MULTIVITAMIN WITH MINERALS) TABS tablet Take 1 tablet by mouth daily.   Yes Historical Provider, MD  nitrofurantoin (MACRODANTIN) 50 MG capsule Take 50 mg by mouth at bedtime.   Yes Historical Provider, MD  polycarbophil (FIBERCON) 625 MG tablet Take 625 mg by mouth daily.   Yes Historical Provider, MD  potassium chloride (MICRO-K) 10 MEQ CR capsule Take 10 mEq by mouth daily.   Yes Historical Provider, MD  Probiotic Product (PROBIOTIC DAILY PO) Take 1 tablet by mouth daily.    Yes Historical Provider, MD  vitamin B-12 (CYANOCOBALAMIN) 100 MCG tablet Take 100 mcg by mouth daily.   Yes Historical Provider, MD  doxycycline (VIBRAMYCIN) 100 MG capsule Take 1 capsule (100 mg total) by mouth 2 (two) times daily. 09/27/15   Marella Chimes, PA-C  traMADol (ULTRAM) 50 MG tablet Take 1 tablet (50 mg total) by mouth every 6 (six) hours as needed. 09/27/15   Marella Chimes, PA-C    BP 156/64 mmHg  Pulse 64  Temp(Src) 98.7 F (37.1 C) (Oral)  Resp 14  SpO2 95% Physical Exam  Constitutional: She is oriented to person, place, and time. She appears well-developed and well-nourished. No distress.  HENT:  Head: Normocephalic and atraumatic.  Right Ear: External ear normal.  Left Ear: External ear normal.  Nose: Nose normal.  Mouth/Throat: Uvula is midline, oropharynx is clear and moist and mucous membranes are normal.  Eyes: Conjunctivae, EOM and lids are normal. Pupils are equal, round, and reactive to light. Right eye exhibits no discharge. Left eye exhibits no discharge. No scleral icterus.  Neck: Normal range of motion. Neck supple.  Cardiovascular: Normal rate, regular rhythm, normal heart sounds, intact distal pulses and normal pulses.   Pulmonary/Chest: Effort normal and breath sounds normal. No respiratory distress. She has no wheezes. She has no rales.  Abdominal: Soft. Normal appearance and bowel sounds are normal. She exhibits no distension and no mass. There is no tenderness. There is no rigidity, no rebound and no guarding.  Musculoskeletal: She exhibits edema and tenderness.  Area of edema and erythema superior to right ankle with associated TTP.  Neurological: She is alert and oriented to person, place, and time. She has normal strength. No cranial nerve deficit or sensory deficit.  Skin: Skin is warm, dry and intact. No rash noted. She is not diaphoretic. There is erythema. No pallor.  Psychiatric: She has a normal mood and affect. Her speech is normal  and behavior is normal.  Nursing note and vitals reviewed.   ED Course  .Marland KitchenIncision and Drainage Date/Time: 09/28/2015 12:50 AM Performed by: Bernerd Limbo C Authorized by: Bernerd Limbo C Consent: Verbal consent obtained. Risks and benefits: risks, benefits and alternatives were discussed Consent given by: patient Patient understanding: patient states understanding of the procedure being performed Patient consent: the patient's understanding of the procedure matches consent given Procedure consent: procedure consent matches procedure scheduled Relevant documents: relevant documents present and verified Site marked: the operative site was marked Required items: required blood products, implants, devices, and special equipment available Patient identity confirmed: verbally with patient Time out: Immediately prior to procedure a "time out" was called to verify the correct patient, procedure, equipment, support staff and site/side  marked as required. Type: hematoma Body area: lower extremity Location details: right ankle Anesthesia: local infiltration Local anesthetic: lidocaine 1% without epinephrine Anesthetic total: 5 ml Patient sedated: no Needle gauge: 18 Incision depth: dermal Complexity: simple Drainage: bloody Drainage amount: scant Patient tolerance: Patient tolerated the procedure well with no immediate complications    Labs Review Labs Reviewed  WOUND CULTURE    Imaging Review Dg Ankle Complete Right  09/27/2015  CLINICAL DATA:  Right ankle pain over the lateral malleolus, cellulitis chronic EXAM: RIGHT ANKLE - COMPLETE 3+ VIEW COMPARISON:  None. FINDINGS: No fracture or dislocation. No periosteal reaction or cortical destruction. IMPRESSION: No acute findings Electronically Signed   By: Skipper Cliche M.D.   On: 09/27/2015 15:09   I have personally reviewed and evaluated these images and lab results as part of my medical decision-making.   EKG  Interpretation None      MDM   Final diagnoses:  Cellulitis of right lower extremity    82 year old female presents with right ankle pain, redness, and swelling. Denies fever, chills, numbness, weakness, paresthesia. Patient is afebrile. Vital signs stable. On exam, she has an area of edema and erythema superior to her right lateral ankle with associated tenderness to palpation. Patient is neurovascularly intact. Imaging of right ankle negative for acute findings. Patient discussed with and seen by Dr. Sabra Heck, who advised patient has fluid collection on ultrasound imaging and to assess for hematoma versus abscess. Attempted to aspirate area, and drew back a small amount of blood. No purulent drainage, however will culture material. Patient previously on bactrim and clindamycin for cellulitis. Will treat with doxycyline. Patient is non-toxic and well-appearing, feel she is stable for discharge at this time. Patient to follow-up with PCP in 2 days for recheck. Return precautions discussed. Patient verbalizes her understanding and is in agreement with plan.   BP 156/64 mmHg  Pulse 64  Temp(Src) 98.7 F (37.1 C) (Oral)  Resp 14  SpO2 95%    Marella Chimes, PA-C 09/28/15 0055  Noemi Chapel, MD 09/28/15 1344

## 2015-09-27 NOTE — ED Notes (Signed)
While in waiting room pt took 600 mg of ibuprofen at approximately 1900.

## 2015-09-27 NOTE — Discharge Instructions (Signed)
1. Medications: doxycycline, tramadol, usual home medications 2. Treatment: rest, drink plenty of fluids 3. Follow Up: please followup with your primary doctor in 2 days for recheck and for discussion of your diagnoses and further evaluation after today's visit; if you do not have a primary care doctor use the resource guide provided to find one; please return to the ER for increased pain, redness, swelling, high fever, new or worsening symptoms   Cellulitis Cellulitis is an infection of the skin and the tissue beneath it. The infected area is usually red and tender. Cellulitis occurs most often in the arms and lower legs.  CAUSES  Cellulitis is caused by bacteria that enter the skin through cracks or cuts in the skin. The most common types of bacteria that cause cellulitis are staphylococci and streptococci. SIGNS AND SYMPTOMS   Redness and warmth.  Swelling.  Tenderness or pain.  Fever. DIAGNOSIS  Your health care provider can usually determine what is wrong based on a physical exam. Blood tests may also be done. TREATMENT  Treatment usually involves taking an antibiotic medicine. HOME CARE INSTRUCTIONS   Take your antibiotic medicine as directed by your health care provider. Finish the antibiotic even if you start to feel better.  Keep the infected arm or leg elevated to reduce swelling.  Apply a warm cloth to the affected area up to 4 times per day to relieve pain.  Take medicines only as directed by your health care provider.  Keep all follow-up visits as directed by your health care provider. SEEK MEDICAL CARE IF:   You notice red streaks coming from the infected area.  Your red area gets larger or turns dark in color.  Your bone or joint underneath the infected area becomes painful after the skin has healed.  Your infection returns in the same area or another area.  You notice a swollen bump in the infected area.  You develop new symptoms.  You have a  fever. SEEK IMMEDIATE MEDICAL CARE IF:   You feel very sleepy.  You develop vomiting or diarrhea.  You have a general ill feeling (malaise) with muscle aches and pains.   This information is not intended to replace advice given to you by your health care provider. Make sure you discuss any questions you have with your health care provider.   Document Released: 06/01/2005 Document Revised: 05/13/2015 Document Reviewed: 11/07/2011 Elsevier Interactive Patient Education [employer].

## 2015-09-30 LAB — WOUND CULTURE
CULTURE: NO GROWTH
Gram Stain: NONE SEEN

## 2015-10-07 ENCOUNTER — Encounter: Payer: Commercial Managed Care - HMO | Attending: Internal Medicine | Admitting: Internal Medicine

## 2015-10-07 DIAGNOSIS — E039 Hypothyroidism, unspecified: Secondary | ICD-10-CM | POA: Insufficient documentation

## 2015-10-07 DIAGNOSIS — M65061 Abscess of tendon sheath, right lower leg: Secondary | ICD-10-CM | POA: Insufficient documentation

## 2015-10-07 DIAGNOSIS — M79661 Pain in right lower leg: Secondary | ICD-10-CM | POA: Insufficient documentation

## 2015-10-07 DIAGNOSIS — I2510 Atherosclerotic heart disease of native coronary artery without angina pectoris: Secondary | ICD-10-CM | POA: Diagnosis not present

## 2015-10-07 DIAGNOSIS — G4730 Sleep apnea, unspecified: Secondary | ICD-10-CM | POA: Insufficient documentation

## 2015-10-07 DIAGNOSIS — Z86718 Personal history of other venous thrombosis and embolism: Secondary | ICD-10-CM | POA: Diagnosis not present

## 2015-10-07 DIAGNOSIS — G40909 Epilepsy, unspecified, not intractable, without status epilepticus: Secondary | ICD-10-CM | POA: Diagnosis not present

## 2015-10-07 DIAGNOSIS — I10 Essential (primary) hypertension: Secondary | ICD-10-CM | POA: Diagnosis not present

## 2015-10-07 DIAGNOSIS — Z853 Personal history of malignant neoplasm of breast: Secondary | ICD-10-CM | POA: Diagnosis not present

## 2015-10-08 NOTE — Progress Notes (Signed)
MELVIS, PEDLEY ([id number]) Visit Report for 10/07/2015 Abuse/Suicide Risk Screen Details Patient Name: Robin Yu, [name] Date of Service: 10/07/2015 1:30 PM Medical Record Patient Account Number: [id number] Number: Treating RN: Ahmed Prima 1922-08-05 (82 y.o. Other Clinician: Date of Birth/Sex: Female) Treating ROBSON, MICHAEL Primary Care Physician/Extender: Mort Sawyers Physician: Referring Physician: Wenda Low Weeks in Treatment: 0 Abuse/Suicide Risk Screen Items Answer ABUSE/SUICIDE RISK SCREEN: Has anyone close to you tried to hurt or harm you recentlyo No Do you feel uncomfortable with anyone in your familyo No Has anyone forced you do things that you didnot want to doo No Do you have any thoughts of harming yourselfo No Patient displays signs or symptoms of abuse and/or neglect. No Electronic Signature(s) Signed: 10/07/2015 5:03:09 PM By: Alric Quan Entered By: Alric Quan on 10/07/2015 14:12:06 [place], Lucianne AMarland Kitchen ([id number]) -------------------------------------------------------------------------------- Activities of Daily Living Details Patient Name: [name], [name] Date of Service: 10/07/2015 1:30 PM Medical Record Patient Account Number: [id number] Number: Treating RN: Ahmed Prima Jun 15, 1922 (82 y.o. Other Clinician: Date of Birth/Sex: Female) Treating ROBSON, MICHAEL Primary Care Physician/Extender: Mort Sawyers Physician: Referring Physician: Wenda Low Weeks in Treatment: 0 Activities of Daily Living Items Answer Activities of Daily Living (Please select one for each item) Drive Automobile Not Able Take Medications Need Assistance Use Telephone Completely Able Care for Appearance Completely Able Use Toilet Completely Able Bath / Shower Need Assistance Dress Self Completely Able Feed Self Completely Able Walk Need Assistance Get In / Out Bed Need Assistance Housework Not Able Prepare Meals Not  Able Handle Money Need Assistance Shop for Self Need Assistance Electronic Signature(s) Signed: 10/07/2015 5:03:09 PM By: Alric Quan Entered By: Alric Quan on 10/07/2015 14:13:27 [place], Thao AMarland Kitchen ([id number]) -------------------------------------------------------------------------------- Education Assessment Details Patient Name: Robin Kaufmann A. Date of Service: 10/07/2015 1:30 PM Medical Record Patient Account Number: [id number] Number: Treating RN: Ahmed Prima 12/15/1921 (82 y.o. Other Clinician: Date of Birth/Sex: Female) Treating ROBSON, MICHAEL Primary Care Physician/Extender: Mort Sawyers Physician: Referring Physician: Andres Labrum in Treatment: 0 Primary Learner Assessed: Patient Learning Preferences/Education Level/Primary Language Learning Preference: Explanation Preferred Language: English Cognitive Barrier Assessment/Beliefs Language Barrier: No Translator Needed: No Memory Deficit: No Emotional Barrier: No Cultural/Religious Beliefs Affecting Medical No Care: Physical Barrier Assessment Impaired Vision: No Impaired Hearing: Yes HOH Decreased Hand dexterity: No Knowledge/Comprehension Assessment Knowledge Level: High Comprehension Level: High Ability to understand written High instructions: Ability to understand verbal High instructions: Motivation Assessment Anxiety Level: Calm Cooperation: Cooperative Education Importance: Acknowledges Need Interest in Health Problems: Asks Questions Perception: Coherent Willingness to Engage in Self- High Management Activities: Readiness to Engage in Self- High Management Activities: [name], [name] ([id number]) Electronic Signature(s) Signed: 10/07/2015 5:03:09 PM By: Alric Quan Entered By: Alric Quan on 10/07/2015 14:14:14 [place], Marionette AMarland Kitchen ([id number]) -------------------------------------------------------------------------------- Fall Risk Assessment  Details Patient Name: [name], [name] Date of Service: 10/07/2015 1:30 PM Medical Record Patient Account Number: [id number] Number: Treating RN: Ahmed Prima Jan 08, 1922 (82 y.o. Other Clinician: Date of Birth/Sex: Female) Treating ROBSON, MICHAEL Primary Care Physician/Extender: Mort Sawyers Physician: Referring Physician: Wenda Low Weeks in Treatment: 0 Fall Risk Assessment Items Have you had 2 or more falls in the last 12 monthso 0 Yes Have you had any fall that resulted in injury in the last 12 monthso 0 No FALL RISK ASSESSMENT: History of falling - immediate or within 3 months 25 Yes Secondary diagnosis 15 Yes Ambulatory aid None/bed rest/wheelchair/nurse 0 No Crutches/cane/walker 15 Yes Furniture 0 No  IV Access/Saline Lock 0 No Gait/Training Normal/bed rest/immobile 0 No Weak 10 Yes Impaired 20 Yes Mental Status Oriented to own ability 0 No Electronic Signature(s) Signed: 10/07/2015 5:03:09 PM By: Alric Quan Entered By: Alric Quan on 10/07/2015 14:15:12 Lawry, Deidrea A. ([id number]) -------------------------------------------------------------------------------- Nutrition Risk Assessment Details Patient Name: [name], [name] Date of Service: 10/07/2015 1:30 PM Medical Record Patient Account Number: [id number] Number: Treating RN: Ahmed Prima 21-Jan-1922 (82 y.o. Other Clinician: Date of Birth/Sex: Female) Treating ROBSON, MICHAEL Primary Care Physician/Extender: Mort Sawyers Physician: Referring Physician: Wenda Low Weeks in Treatment: 0 Height (in): Weight (lbs): 150 Body Mass Index (BMI): Nutrition Risk Assessment Items NUTRITION RISK SCREEN: I have an illness or condition that made me change the kind and/or 2 Yes amount of food I eat I eat fewer than two meals per day 3 Yes I eat few fruits and vegetables, or milk products 0 No I have three or more drinks of beer, liquor or wine almost every day 0 No I  have tooth or mouth problems that make it hard for me to eat 0 No I don't always have enough money to buy the food I need 0 No I eat alone most of the time 0 No I take three or more different prescribed or over-the-counter drugs a 1 Yes day Without wanting to, I have lost or gained 10 pounds in the last six 0 No months I am not always physically able to shop, cook and/or feed myself 2 Yes Nutrition Protocols Good Risk Protocol Moderate Risk Protocol Electronic Signature(s) Signed: 10/07/2015 5:03:09 PM By: Alric Quan Entered By: Alric Quan on 10/07/2015 14:16:21

## 2015-10-09 NOTE — Progress Notes (Signed)
EADEN, WOHLGEMUTH ([id number]) Visit Report for 10/07/2015 Allergy List Details Patient Name: Robin [name], [name] Date of Service: 10/07/2015 1:30 PM Medical Record Patient Account Number: [id number] Number: Treating RN: Ahmed Prima 22-Nov-1921 (82 y.o. Other Clinician: Date of Birth/Sex: Female) Treating ROBSON, MICHAEL Primary Care Physician: Wenda Low Physician/Extender: G Referring Physician: Wenda Low Weeks in Treatment: 0 Allergies Active Allergies penicillin Reaction: rash Severity: Severe Allergy Notes Electronic Signature(s) Signed: 10/07/2015 5:03:09 PM By: Alric Quan Entered By: Alric Quan on 10/07/2015 14:07:40 Robin [name], [name] ([id number]) -------------------------------------------------------------------------------- Arrival Information Details Patient Name: Robin Kaufmann A. Date of Service: 10/07/2015 1:30 PM Medical Record Patient Account Number: [id number] Number: Treating RN: Ahmed Prima 11-25-1921 (82 y.o. Other Clinician: Date of Birth/Sex: Female) Treating ROBSON, [place] Primary Care Physician: Wenda Low Physician/Extender: G Referring Physician: Andres Labrum in Treatment: 0 Visit Information Patient Arrived: Wheel Chair Arrival Time: 14:01 Accompanied By: daughter Transfer Assistance: EasyPivot Patient Lift Patient Identification Verified: Yes Secondary Verification Process Yes Completed: Patient Requires Transmission- No Based Precautions: Patient Has Alerts: No Electronic Signature(s) Signed: 10/07/2015 5:03:09 PM By: Alric Quan Entered By: Alric Quan on 10/07/2015 14:01:53 Robin [name], Robin AMarland Kitchen ([id number]) -------------------------------------------------------------------------------- Clinic Level of Care Assessment Details Patient Name: Lovvorn, Khrystyne A. Date of Service: 10/07/2015 1:30 PM Medical Record Patient Account Number: [id number] Number: Treating RN: Baruch Gouty, RN, BSN,  Rita 07-03-1922 [phone] y.o. Other Clinician: Date of Birth/Sex: Female) Treating ROBSON, [place] Primary Care Physician: Wenda Low Physician/Extender: G Referring Physician: Andres Labrum in Treatment: 0 Clinic Level of Care Assessment Items TOOL 2 Quantity Score []  - Use when only an EandM is performed on the INITIAL visit 0 ASSESSMENTS - Nursing Assessment / Reassessment X - General Physical Exam (combine w/ comprehensive assessment (listed just 1 20 below) when performed on new pt. evals) X - Comprehensive Assessment (HX, ROS, Risk Assessments, Wounds Hx, etc.) 1 25 ASSESSMENTS - Wound and Skin Assessment / Reassessment []  - Simple Wound Assessment / Reassessment - one wound 0 []  - Complex Wound Assessment / Reassessment - multiple wounds 0 X - Dermatologic / Skin Assessment (not related to wound area) 1 10 ASSESSMENTS - Ostomy and/or Continence Assessment and Care []  - Incontinence Assessment and Management 0 []  - Ostomy Care Assessment and Management (repouching, etc.) 0 PROCESS - Coordination of Care X - Simple Patient / Family Education for ongoing care 1 15 []  - Complex (extensive) Patient / Family Education for ongoing care 0 []  - Staff obtains Programmer, systems, Records, Test Results / Process Orders 0 []  - Staff telephones HHA, Nursing Homes / Clarify orders / etc 0 []  - Routine Transfer to another Facility (non-emergent condition) 0 []  - Routine Hospital Admission (non-emergent condition) 0 X - New Admissions / Biomedical engineer / Ordering NPWT, Apligraf, etc. 1 15 []  - Emergency Hospital Admission (emergent condition) 0 Robin [name], [name] ([id number]) []  - Simple Discharge Coordination 0 []  - Complex (extensive) Discharge Coordination 0 PROCESS - Special Needs []  - Pediatric / Minor Patient Management 0 []  - Isolation Patient Management 0 []  - Hearing / Language / Visual special needs 0 []  - Assessment of Community assistance (transportation, D/C planning, etc.)  0 []  - Additional assistance / Altered mentation 0 []  - Support Surface(s) Assessment (bed, cushion, seat, etc.) 0 INTERVENTIONS - Wound Cleansing / Measurement []  - Wound Imaging (photographs - any number of wounds) 0 []  - Wound Tracing (instead of photographs) 0 []  - Simple Wound Measurement - one wound 0 []  - Complex Wound Measurement -  multiple wounds 0 []  - Simple Wound Cleansing - one wound 0 []  - Complex Wound Cleansing - multiple wounds 0 INTERVENTIONS - Wound Dressings []  - Small Wound Dressing one or multiple wounds 0 []  - Medium Wound Dressing one or multiple wounds 0 []  - Robin [name] Wound Dressing one or multiple wounds 0 []  - Application of Medications - injection 0 INTERVENTIONS - Miscellaneous []  - External ear exam 0 []  - Specimen Collection (cultures, biopsies, blood, body fluids, etc.) 0 []  - Specimen(s) / Culture(s) sent or taken to Lab for analysis 0 []  - Patient Transfer (multiple staff / Harrel Lemon Lift / Similar devices) 0 []  - Simple Staple / Suture removal (25 or less) 0 Robin [name], [name] ([id number]) []  - Complex Staple / Suture removal (26 or more) 0 []  - Hypo / Hyperglycemic Management (close monitor of Blood Glucose) 0 X - Ankle / Brachial Index (ABI) - do not check if billed separately 1 15 Has the patient been seen at the hospital within the last three years: Yes Total Score: 100 Level Of Care: New/Established - Level 3 Electronic Signature(s) Signed: 10/08/2015 4:11:51 PM By: Regan Lemming BSN, RN Entered By: Regan Lemming on 10/07/2015 14:44:58 Robin [name], Robin AMarland Kitchen ([id number]) -------------------------------------------------------------------------------- Encounter Discharge Information Details Patient Name: Robin [name], [name] Date of Service: 10/07/2015 1:30 PM Medical Record Patient Account Number: [id number] Number: Treating RN: Baruch Gouty, RN, BSN, Rita 05/24/1922 [phone] y.o. Other Clinician: Date of Birth/Sex: Female) Treating ROBSON, MICHAEL Primary Care  Physician: Wenda Low Physician/Extender: G Referring Physician: Andres Labrum in Treatment: 0 Encounter Discharge Information Items Discharge Pain Level: 0 Discharge Condition: Stable Ambulatory Status: Ambulatory Discharge Destination: Home Transportation: Private Auto Accompanied By: dtr Schedule Follow-up Appointment: No Medication Reconciliation completed and provided to Patient/Care No Lavonn Maxcy: Provided on Clinical Summary of Care: 10/07/2015 Form Type Recipient Paper Patient [facility] Electronic Signature(s) Signed: 10/07/2015 2:48:27 PM By: Ruthine Dose Entered By: Ruthine Dose on 10/07/2015 14:48:27 Robin [name], [name] ([id number]) -------------------------------------------------------------------------------- Lower Extremity Assessment Details Patient Name: Robin [name], [name] Date of Service: 10/07/2015 1:30 PM Medical Record Patient Account Number: [id number] Number: Treating RN: Ahmed Prima 08/30/1922 (82 y.o. Other Clinician: Date of Birth/Sex: Female) Treating ROBSON, MICHAEL Primary Care Physician: Wenda Low Physician/Extender: G Referring Physician: Wenda Low Weeks in Treatment: 0 Edema Assessment Assessed: [Left: No] [Right: No] E[Left: dema] [Right: :] Calf Left: Right: Point of Measurement: 35 cm From Medial Instep 35.5 cm 33.9 cm Ankle Left: Right: Point of Measurement: 10 cm From Medial Instep 20.6 cm 21.5 cm Vascular Assessment Pulses: Posterior Tibial Dorsalis Pedis Palpable: [Left:No] [Right:No] Doppler: [Left:Monophasic] [Right:Monophasic] Extremity colors, hair growth, and conditions: Extremity Color: [Left:Normal] [Right:Normal] Hair Growth on Extremity: [Left:Yes] [Right:Yes] Temperature of Extremity: [Left:Warm] [Right:Warm] Capillary Refill: [Left:< 3 seconds] [Right:< 3 seconds] Blood Pressure: Brachial: [Left:156] [Right:156] Dorsalis Pedis: 138 [Left:Dorsalis Pedis: 158] Ankle: Posterior Tibial:  150 [Left:Posterior Tibial: 160 0.96] [Right:1.03] Toe Nail Assessment Left: Right: Thick: No No Discolored: No No Deformed: No No Improper Length and Hygiene: No No Crader, Keirsten A. ([id number]) Electronic Signature(s) Signed: 10/07/2015 5:03:09 PM By: Alric Quan Entered By: Alric Quan on 10/07/2015 14:18:50 Pasley, Marlinda A. ([id number]) -------------------------------------------------------------------------------- Multi Wound Chart Details Patient Name: Robin [name], [name] Date of Service: 10/07/2015 1:30 PM Medical Record Patient Account Number: [id number] Number: Treating RN: Ahmed Prima 07/06/1922 (82 y.o. Other Clinician: Date of Birth/Sex: Female) Treating ROBSON, [place] Primary Care Physician: Wenda Low Physician/Extender: G Referring Physician: Wenda Low Weeks in Treatment: 0 Vital Signs Height(in): Pulse(bpm): 62 Weight(lbs):  150 Blood Pressure 156/54 (mmHg): Body Mass Index(BMI): Temperature(F): 97.9 Respiratory Rate 18 (breaths/min): Wound Assessments Treatment Notes Electronic Signature(s) Signed: 10/07/2015 5:03:09 PM By: Alric Quan Entered By: Alric Quan on 10/07/2015 14:24:33 Robin [name], Robin [name] ([id number]) -------------------------------------------------------------------------------- Multi-Disciplinary Care Plan Details Patient Name: Robin [name], [name] Date of Service: 10/07/2015 1:30 PM Medical Record Patient Account Number: [id number] Number: Treating RN: Ahmed Prima 1922-05-23 (82 y.o. Other Clinician: Date of Birth/Sex: Female) Treating ROBSON, MICHAEL Primary Care Physician: Wenda Low Physician/Extender: G Referring Physician: Wenda Low Weeks in Treatment: 0 Active Inactive Electronic Signature(s) Signed: 10/07/2015 5:03:09 PM By: Alric Quan Entered By: Alric Quan on 10/07/2015 14:24:21 Robin [name], [name]  ([id number]) -------------------------------------------------------------------------------- Pain Assessment Details Patient Name: Culp, Aziah A. Date of Service: 10/07/2015 1:30 PM Medical Record Patient Account Number: [id number] Number: Treating RN: Ahmed Prima 07/27/1922 (82 y.o. Other Clinician: Date of Birth/Sex: Female) Treating ROBSON, [place] Primary Care Physician: Wenda Low Physician/Extender: G Referring Physician: Andres Labrum in Treatment: 0 Active Problems Location of Pain Severity and Description of Pain Patient Has Paino Yes Site Locations With Dressing Change: No Duration of the Pain. Constant / Intermittento Constant Rate the pain. Current Pain Level: 5 Character of Pain Describe the Pain: Stabbing, Throbbing Pain Management and Medication Current Pain Management: Electronic Signature(s) Signed: 10/07/2015 5:03:09 PM By: Alric Quan Entered By: Alric Quan on 10/07/2015 14:03:10 Plemons, Robin [name] ([id number]) -------------------------------------------------------------------------------- Patient/Caregiver Education Details Patient Name: Robin Kaufmann A. Date of Service: 10/07/2015 1:30 PM Medical Record Patient Account Number: [id number] Number: Treating RN: Baruch Gouty, RN, BSN, Rita 11-21-1921 [phone] y.o. Other Clinician: Date of Birth/Gender: Female) Treating ROBSON, MICHAEL Primary Care Physician: Wenda Low Physician/Extender: G Referring Physician: Andres Labrum in Treatment: 0 Education Assessment Education Provided To: Patient Education Topics Provided Electronic Signature(s) Signed: 10/08/2015 4:11:51 PM By: Regan Lemming BSN, RN Entered By: Regan Lemming on 10/07/2015 14:45:30 Ferrucci, Dara AMarland Kitchen ([id number]) -------------------------------------------------------------------------------- Vitals Details Patient Name: Robin Kaufmann A. Date of Service: 10/07/2015 1:30 PM Medical Record Patient Account  Number: [id number] Number: Treating RN: Ahmed Prima March 03, 1922 (82 y.o. Other Clinician: Date of Birth/Sex: Female) Treating ROBSON, [place] Primary Care Physician: Wenda Low Physician/Extender: G Referring Physician: Wenda Low Weeks in Treatment: 0 Vital Signs Time Taken: 14:03 Temperature (F): 97.9 Weight (lbs): 150 Pulse (bpm): 62 Source: Stated Respiratory Rate (breaths/min): 18 Blood Pressure (mmHg): 156/54 Reference Range: 80 - 120 mg / dl Electronic Signature(s) Signed: 10/07/2015 5:03:09 PM By: Alric Quan Entered By: Alric Quan on 10/07/2015 14:05:58

## 2015-10-09 NOTE — Progress Notes (Signed)
KIZZIE, COY ([id number]) Visit Report for 10/07/2015 Chief Complaint Document Details Patient Name: [name], [name] Date of Service: 10/07/2015 1:30 PM Medical Record Patient Account Number: [id number] Number: Treating RN: Baruch Gouty, RN, BSN, Rita 26-Jun-1922 [phone] y.o. Other Clinician: Date of Birth/Sex: Female) Treating Ozie Dimaria Primary Care Physician/Extender: Mort Sawyers Physician: Referring Physician: Wenda Low Weeks in Treatment: 0 Information Obtained from: Patient Chief Complaint Patient was referred by Dr. Deforest Hoyles at [facility] for review of a painful swelling on the right anterior lateral leg just above the ankle. Electronic Signature(s) Signed: 10/07/2015 4:36:57 PM By: Linton Ham MD Entered By: Linton Ham on 10/07/2015 15:21:44 Robin [name], Robin [name] ([id number]) -------------------------------------------------------------------------------- HPI Details Patient Name: Robin Kaufmann A. Date of Service: 10/07/2015 1:30 PM Medical Record Patient Account Number: [id number] Number: Treating RN: Baruch Gouty, RN, BSN, Rita 04/16/1922 [phone] y.o. Other Clinician: Date of Birth/Sex: Female) Treating Consetta Cosner Primary Care Physician/Extender: Mort Sawyers Physician: Referring Physician: Wenda Low Weeks in Treatment: 0 History of Present Illness HPI Description: 10/07/15; this is a 82 year old woman who is accompanied by her daughter. The history is that the patient developed a very painful swelling in her right leg just before Christmas. She was treated as an outpatient with antibiotics for cellulitis and diuretics for the swelling. And most of this resolved however she has been left with a golf ball sized swelling on the right anterior leg just above the ankle joint. This is not particularly erythematous but it is painful. He did have a fall before Christmas without obvious injury. She was in the emergency room on 1/18. Ultrasound of  this area showed that it was fluid filled the. X-ray of the ankle did not show a fracture. An aspirate of the fluid ultimately turned out to be negative for culture. She was discharged on doxycycline. The swelling on the right leg continues to be painful Electronic Signature(s) Signed: 10/07/2015 4:36:57 PM By: Linton Ham MD Entered By: Linton Ham on 10/07/2015 15:23:44 Robin [name], Robin [name] ([id number]) -------------------------------------------------------------------------------- Physical Exam Details Patient Name: Hay, [name] Date of Service: 10/07/2015 1:30 PM Medical Record Patient Account Number: [id number] Number: Treating RN: Baruch Gouty, RN, BSN, Rita 1921-12-01 [phone] y.o. Other Clinician: Date of Birth/Sex: Female) Treating Darin Arndt Primary Care Physician/Extender: Mort Sawyers Physician: Referring Physician: Andres Labrum in Treatment: 0 Notes Physical examination; Gen: Healthy appearing 82 yo female Vitals stable as listed above Resp: a/e is clear CVS:HS normal no murmers Ext: PP palpable at the PT and DP. There is not any edema MSK: As described there is a swelling on the anterior lateral right leg. This is not erythematous but very tender. There also seems to be some tenderness along the joint line of the ankle especially anteriorly and laterally but there is no effusion. Electronic Signature(s) Signed: 10/07/2015 4:36:57 PM By: Linton Ham MD Entered By: Linton Ham on 10/07/2015 15:26:25 Robin [name] ([id number]) -------------------------------------------------------------------------------- Physician Orders Details Patient Name: Robin [name], [name] Date of Service: 10/07/2015 1:30 PM Medical Record Patient Account Number: [id number] Number: Treating RN: Baruch Gouty, RN, BSN, Rita November 02, 1921 [phone] y.o. Other Clinician: Date of Birth/Sex: Female) Treating Lakyia Behe Primary Care Physician/Extender: Mort Sawyers Physician: Referring Physician: Andres Labrum in Treatment: 0 Verbal / Phone Orders: Yes Clinician: Afful, RN, BSN, Rita Read Back and Verified: Yes Diagnosis Coding Discharge From [facility] o Discharge from [facility] completed. Consult Only. MD instructed patient and daughter to go to PCP/ orthopedics for  further evaluations. Electronic Signature(s) Signed: 10/07/2015 4:36:57 PM By: Linton Ham MD Signed: 10/08/2015 4:11:51 PM By: Regan Lemming BSN, RN Entered By: Regan Lemming on 10/07/2015 14:42:23 Robin [name], Robin [name] ([id number]) -------------------------------------------------------------------------------- Problem List Details Patient Name: [name], [name] Date of Service: 10/07/2015 1:30 PM Medical Record Patient Account Number: [id number] Number: Treating RN: Baruch Gouty, RN, BSN, Rita [birth year] [phone] y.o. Other Clinician: Date of Birth/Sex: Female) Treating Melissa Tomaselli Primary Care Physician/Extender: Mort Sawyers Physician: Referring Physician: Wenda Low Weeks in Treatment: 0 Active Problems ICD-10 Encounter Code Description Active Date Diagnosis M79.661 Pain in right lower leg 10/07/2015 Yes M65.061 Abscess of tendon sheath, right lower leg 10/07/2015 Yes Inactive Problems Resolved Problems Electronic Signature(s) Signed: 10/07/2015 4:36:57 PM By: Linton Ham MD Entered By: Linton Ham on 10/07/2015 15:21:12 Robin [name], [name] ([id number]) -------------------------------------------------------------------------------- Progress Note Details Patient Name: Robin [name], [name] Date of Service: 10/07/2015 1:30 PM Medical Record Patient Account Number: [id number] Number: Treating RN: Baruch Gouty, RN, BSN, Rita 31-Dec-1921 [phone] y.o. Other Clinician: Date of Birth/Sex: Female) Treating Mozell Haber Primary Care Physician/Extender: Mort Sawyers Physician: Referring Physician: Wenda Low Weeks in Treatment:  0 Plan #1 I had wanted to aspirate this area but we were not equipped to do this in the clinic. I think the fluid should be sent again for repeat culture, cell count and crystals. #2 I have suggested orthopedic surgery, among other things I think she needs probably more focused x-rays of the ankle and distal tib-fib to make sure that there was not an occult fracture missed line #3 she had tenderness along the ankle joint which was separated from the fluid collection. Making me wonder whether it is possible that she has some form of acute synovitis #4 the patient did not have an open wound, I did not think it was in her interest to be followed here. As stated I've suggested orthopedic surgery for the reasons noted above. Electronic Signature(s) Signed: 10/07/2015 4:36:57 PM By: Linton Ham MD Entered By: Linton Ham on 10/07/2015 15:28:08 Carmina Miller ([id number]) -------------------------------------------------------------------------------- ROS/PFSH Details Patient Name: Robin Kaufmann A. Date of Service: 10/07/2015 1:30 PM Medical Record Patient Account Number: [id number] Number: Treating RN: Ahmed Prima 23-Oct-1921 (82 y.o. Other Clinician: Date of Birth/Sex: Female) Treating Siddalee Vanderheiden Primary Care Physician/Extender: Mort Sawyers Physician: Referring Physician: Wenda Low Weeks in Treatment: 0 Information Obtained From Patient Caregiver Wound History Do you currently have one or more open woundso No Have you tested positive for osteomyelitis (bone infection)o No Have you had any tests for circulation on your legso No Have you had other problems associated with your woundso Swelling Integumentary (Skin) Complaints and Symptoms: Positive for: Swelling Medical History: Negative for: History of Burn; History of pressure wounds Musculoskeletal Complaints and Symptoms: Positive for: Muscle Weakness Medical History: Past Medical History  Notes: osteoporosis Constitutional Symptoms (General Health) Complaints and Symptoms: No Complaints or Symptoms Eyes Complaints and Symptoms: No Complaints or Symptoms Ear/Nose/Mouth/Throat Complaints and Symptoms: No Complaints or Symptoms Robin [name], [name] ([id number]) Medical History: Past Medical History Notes: Allergic rhnitis, bilateral hearing loss, hearing aids Hematologic/Lymphatic Complaints and Symptoms: No Complaints or Symptoms Medical History: Negative for: Anemia; Hemophilia; Human Immunodeficiency Virus; Lymphedema; Sickle Cell Disease Respiratory Complaints and Symptoms: No Complaints or Symptoms Medical History: Positive for: Sleep Apnea Cardiovascular Complaints and Symptoms: No Complaints or Symptoms Medical History: Positive for: Coronary Artery Disease; Deep Vein Thrombosis; Hypertension Past Medical History Notes: Aortic stenosis, Gastrointestinal Complaints and Symptoms: No Complaints or Symptoms Medical History:  Positive for: Colitis Past Medical History Notes: acute diverticulitis Endocrine Complaints and Symptoms: No Complaints or Symptoms Medical History: Past Medical History Notes: hypothyroidism Genitourinary Caprio, Nikkol A. ([id number]) Complaints and Symptoms: No Complaints or Symptoms Medical History: Past Medical History Notes: frequent uti, chronic cystitis Immunological Complaints and Symptoms: No Complaints or Symptoms Medical History: Negative for: Lupus Erythematosus; Raynaudos; Scleroderma Neurologic Complaints and Symptoms: No Complaints or Symptoms Medical History: Positive for: Seizure Disorder Oncologic Complaints and Symptoms: No Complaints or Symptoms Medical History: Past Medical History Notes: left breast cancer s/p mastectomy Psychiatric Complaints and Symptoms: No Complaints or Symptoms Family and Social History Never smoker; Marital Status - Widowed; Alcohol Use: Never; Drug Use: No History;  Caffeine Use: Never; Financial Concerns: No; Food, Clothing or Shelter Needs: No; Support System Lacking: No; Transportation Concerns: No; Advanced Directives: Yes; Living Will: Yes; [place]: Yes - [place] Licensed conveyancer) Signed: 10/07/2015 4:36:57 PM By: Linton Ham MD Signed: 10/07/2015 5:03:09 PM By: Alric Quan Previous Signature: 10/07/2015 8:56:50 AM Version By: Regan Lemming BSN, RN Entered By: Alric Quan on 10/07/2015 14:11:02 Jeremiah, Malaia [name] [name] ([id number]) -------------------------------------------------------------------------------- SuperBill Details Patient Name: Wilensky, Bridgette A. Date of Service: 10/07/2015 Medical Record Patient Account Number: [id number] Number: Treating RN: Baruch Gouty, RN, BSN, Rita July 01, 1922 [phone] y.o. Other Clinician: Date of Birth/Sex: Female) Treating Braian Tijerina Primary Care Physician/Extender: Mort Sawyers Physician: Suella [place] in Treatment: 0 Referring Physician: Wenda Low Diagnosis Coding ICD-10 Codes Code Description M79.661 Pain in right lower leg M65.061 Abscess of tendon sheath, right lower leg Facility Procedures CPT4 Code: [id number] Description: [id number] - WOUND CARE VISIT-LEV 3 EST PT Modifier: Quantity: 1 Physician Procedures CPT4 Code: [id number] Description: [id number] - WC PHYS LEVEL 2 - NEW PT ICD-10 Description Diagnosis M79.661 Pain in right lower leg Modifier: Quantity: 1 Electronic Signature(s) Signed: 10/07/2015 4:36:57 PM By: Linton Ham MD Entered By: Linton Ham on 10/07/2015 15:28:38

## 2016-02-17 ENCOUNTER — Other Ambulatory Visit: Payer: Self-pay | Admitting: Internal Medicine

## 2016-02-17 DIAGNOSIS — M810 Age-related osteoporosis without current pathological fracture: Secondary | ICD-10-CM

## 2016-02-17 DIAGNOSIS — Z1231 Encounter for screening mammogram for malignant neoplasm of breast: Secondary | ICD-10-CM

## 2016-03-04 ENCOUNTER — Ambulatory Visit: Payer: Commercial Managed Care - HMO

## 2016-03-04 ENCOUNTER — Other Ambulatory Visit: Payer: Commercial Managed Care - HMO

## 2016-03-17 ENCOUNTER — Ambulatory Visit: Payer: Commercial Managed Care - HMO

## 2016-03-17 ENCOUNTER — Other Ambulatory Visit: Payer: Commercial Managed Care - HMO

## 2019-01-04 DEATH — deceased
